# Patient Record
Sex: Female | Born: 1990 | Race: Black or African American | Hispanic: No | Marital: Single | Smoking: Never smoker
Health system: Southern US, Community
[De-identification: ages and names within clinical notes are randomized; demographics above are authoritative.]

## PROBLEM LIST (undated history)

## (undated) ENCOUNTER — Inpatient Hospital Stay (HOSPITAL_COMMUNITY): Payer: Self-pay

## (undated) DIAGNOSIS — I517 Cardiomegaly: Secondary | ICD-10-CM

## (undated) DIAGNOSIS — O139 Gestational [pregnancy-induced] hypertension without significant proteinuria, unspecified trimester: Secondary | ICD-10-CM

## (undated) DIAGNOSIS — R079 Chest pain, unspecified: Secondary | ICD-10-CM

## (undated) DIAGNOSIS — K5790 Diverticulosis of intestine, part unspecified, without perforation or abscess without bleeding: Secondary | ICD-10-CM

## (undated) HISTORY — DX: Chest pain, unspecified: R07.9

## (undated) HISTORY — DX: Diverticulosis of intestine, part unspecified, without perforation or abscess without bleeding: K57.90

## (undated) HISTORY — DX: Cardiomegaly: I51.7

---
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
## 2008-08-01 ENCOUNTER — Emergency Department (HOSPITAL_COMMUNITY): Admission: EM | Admit: 2008-08-01 | Discharge: 2008-08-01 | Payer: Self-pay | Admitting: Emergency Medicine

## 2009-03-11 ENCOUNTER — Emergency Department (HOSPITAL_COMMUNITY): Admission: EM | Admit: 2009-03-11 | Discharge: 2009-03-11 | Payer: Self-pay | Admitting: Emergency Medicine

## 2010-12-30 ENCOUNTER — Emergency Department (HOSPITAL_COMMUNITY)
Admission: EM | Admit: 2010-12-30 | Discharge: 2010-12-30 | Disposition: A | Discharge status: Home / Self Care | Payer: Medicaid Other | Attending: Emergency Medicine | Admitting: Emergency Medicine

## 2010-12-30 DIAGNOSIS — H5789 Other specified disorders of eye and adnexa: Secondary | ICD-10-CM | POA: Insufficient documentation

## 2010-12-30 DIAGNOSIS — H109 Unspecified conjunctivitis: Secondary | ICD-10-CM | POA: Insufficient documentation

## 2011-03-07 NOTE — Discharge Summary (Signed)
[name], [name]             ACCOUNT NO.:  [id number]   MEDICAL RECORD NO.:  [id number]          PATIENT TYPE:  INP   LOCATION:  9104                          FACILITY:  [facility]   PHYSICIAN:  Lesly Dukes, M.D. DATE OF BIRTH:  12-03-1990   DATE OF ADMISSION:  04/17/2005  DATE OF DISCHARGE:  04/24/2005                                 DISCHARGE SUMMARY   ADMISSION DIAGNOSES:  Intrauterine pregnancy.   DISCHARGE DIAGNOSES:  1.  Intrauterine pregnancy at [redacted] weeks gestation.  2.  Preeclampsia.  3.  Endometritis.   PROCEDURES:  Low transverse cesarean section reasons:  Fetal tachycardia  with variable fetal heart rate decelerations, arrest of active phase of  labor.   COMPLICATIONS:  Noted anaerobic infection from the uterus during cesarean  section.   HISTORY OF PRESENT ILLNESS:  Patient is a 20 year old G1, P0-0-0 presenting  at 40 weeks with a labor concern of contractions x1 day.  Patient was seen  in MAU on April 17, 2005.  Noted to be 2.5, 80, and -1 per cervical check.  Baseline fetal heart rate at 145 and reactive with moderate variability.  Patient was noted to be slightly elevated blood pressures at 149/98, 137/79,  137/91 in MAU.  Patient was admitted for [facility] laboratories and active labor.  Patient was noted to be rubella immune and GBS negative on admission with  negative GC and Chlamydia and declined HIV test.  Patient was admitted to  labor and delivery and given a trial of vaginal delivery with Pitocin.  Patient was noted to have trial for approximately 13 hours in labor with  arrest of labor.  It was also noted that fetal tachycardia with variable  fetal heart rates and decelerations upon contraction and with patient on  oxygen on her side it was noted that patient would be a cesarean delivery.  Risks and benefits of the procedure were discussed with patient.  Patient  consents were signed and on chart.  Patient decided to have low transverse  cesarean  section.   Dr. Corky Sox performed low transverse cesarean section on April 18, 2005.  Patient was brought to the operating room and anesthesia was  adequate.  A low transverse uterine incision was made.  The baby was noted  to have meconium stained fluid when the baby's head was delivered.  The  baby's head was DeLee suctioned.  Remainder of the baby was delivered and  the cord doubly clamped and cut, the baby handed to the neonatologists in  attendance.  The placenta delivered spontaneously.  However, prior to  delivery the placenta cord gases were attempted to be obtained but both  arteries were flat and several attempts were made for the umbilical artery  at the center, but were unable to obtain blood so a venous gas was sent.  Placenta was delivered spontaneously.  It was noted during the closure that  there was a hematoma that started approximately in the midline and went to  the left side of the patient in the bladder flap; however, this was stable  throughout the remainder of the procedure.  The patient delivered a female  baby with Apgars of 8 and 9 with weight of 6 pounds 9 ounces.  Patient was  followed up in the PACU and was continued on the medications which were  started during labor and delivery for her noted elevated high blood  pressures.  Patient was transported to the AICU for preeclampsia.  The  patient stayed for one day in the AICU, was noted to diurese well and was  then transferred to the floor.  Upon being transferred to the floor it was  noted that the patient began to develop temperatures on April 20, 2005 at  2300, was appropriately tender in her abdominal region.  The incision was  clear, dry, and intact.  It was noted that this could be probable  endometritis and that IV antibiotics would be needed.  There was an attempt  x2 performed on the IV on the right hand and both attempts were unsuccessful  performed by nurse Hutchins.  The patient then decided that  she would not  have IV antibiotics and nurse was brought into the room to discuss the need  for IV antibiotics and the patient was reassured that she could refuse any  treatment.  Patient refused IV antibiotics at this time.  Patient did  receive one dose of Cleocin p.o. on April 21, 2005.  It was noted then the  patient continued to have elevated temperatures of 101.6 at 1440 on April 21, 2005.  Once again, Dr. Dillard Essex discussed with patient and mother and  neighbor in room of the patient's need for IV antibiotics for treatment for  endometritis.  This need was also apparent due to her high elevations in  temperatures and the appropriate tenderness of her uterus.  Dr. Shawnie Pons was  brought into the room, attending, and discussed with patient of the need for  IV antibiotics.  The patient refused to receive IV antibiotics at this time  as well.  Patient stated she hated needles and the last nurse tried to stick  her several times and no luck with the IV.  Patient understood the lack of  improvement with the IV antibiotics and the risk of hysterectomy and death.  House coverage was contacted by Dr. Shawnie Pons at this time.   On April 21, 2005 1747 Dr. Dillard Essex and Dr. Shawnie Pons were called to the room by  the patient and the patient had decided to have IV placed by  anesthesiologist and receive IV antibiotics.  IV ampicillin and gentamicin  was begun at April 21, 2005 approximately 10 p.m. in the evening.  Patient was  noted once after starting IV antibiotics that there were no temperatures  above 100.5 with a Tmax being 100.3 and 100.2 on April 23, 2005.  The  patient's blood pressures remained stable; however, slightly elevated with  diastolics in the 90s throughout this admission.  The patient received one  day of antibiotics and on April 23, 2005 requested to go home.  The patient  was informed that the blood cultures and sensitivities were not back and needed to be confirmed for adequate __________ antibiotic  coverage p.o. once  she was home.  The patient's mother, also the maternal grandmother of the  baby called on the telephone, name being Rio Taber and discussed with  Dr. Dillard Essex and asked to speak to the doctor.  The patient's mother demanded  that the daughter go home with her baby.  The mother was informed of the  said medical  recommendations of the IV antibiotics and the importance of  staying afebrile for 48 hours post antibiotics.  The mother stated  expletives, and was informed of medical recommendation for IV antibiotics.  The patient understood that leaving prior to 48 hours being afebrile  starting antibiotics was against medical advice.  Patient did decide to stay  overnight as per the recommendations.  On July 6 it was noted that patient  did not have any temperatures above 100.5.  Her vital signs were stable;  however, blood pressures were 140s/120s systolic and 70s/90s diastolic.  General appearance was no apparent distress.  She was resting comfortably,  eating breakfast, lying in her bed.  Cardiovascular:  She was regular rate  and rhythm, no murmurs, rubs, or gallops.  Lungs were clear to auscultation  bilaterally.  Abdomen:  The incision was clear, dry, and intact.  She had a  firm uterus with moderate tenderness rating 2 out of a 10 on a pain scale.  Her laboratories revealed micro cultures x2 were no growth for five days.  The patient was then informed that she was able to go home on p.o.  antibiotics of Cipro and Flagyl.  Patient was also noted that she needed to  have a home health nurse or mother __________ unit nurse come and check her  blood pressure on the day following discharge for a recheck of the elevated  diastolic pressures.  Patient understood and voiced understanding of these  concerns.  Patient is now stable and ready for discharge on April 24, 2005.  Patient will be followed up by [facility], Dr. Shawnie Pons in two weeks and by  [facility] in six  weeks.  Patient is sent home on the Cipro and  Flagyl.  She will be followed up as discussed above.  The patient will be  receiving a Depo Provera shot IM 150 mg prior to discharge for  contraception.  The baby was a female.   Also to note case management was contacted throughout this entire admission  on the hospital environment of the interactions between the mother of the  patient and the sister of the patient.  It was noted that security was  called during the labor and delivery process.  Disagreements were noted  throughout the labor and delivery processes in the note per the nursing note  on June 30 and in the ICU for the regard of the patient.  There were also  encounters with the patient's mother where expletives were used throughout  and house coverage had to be notified.  Care management was notified  throughout this entire procedure and documented that the patient did have a stable home and will receive support and need from her mother at home.   DISCHARGE LABORATORIES:  Vital signs with temperature 99.2, pulse 99,  respirations 20, blood pressure 144/93.  Patient received 48 hours' worth of  IV clindamycin and gentamicin.  Postoperative hemoglobin was 8.7.  Sodium  141, potassium 3.7, BUN 2, creatinine 0.6.  Total bilirubin 0.7, alkaline  phosphatase 145, AST 17, ALT 9, total protein 5.4, albumin 1.7, calcium 8.6,  uric acid 4.5, LDH 138.  These were performed on July 3.  Also, chloride was  98, CO2 28, glucose 85.   DISCHARGE MEDICATIONS:  1.  Patient will be discharged home on Flagyl 500 mg one p.o. daily x7 days.      Will receive one dose prior to discharge.  2.  Cipro 500 mg t.i.d. x7 days.  3.  Ibuprofen 600 mg one tablet q.6h. p.r.n. pain.  4.  Depo Provera 150 mg IM x1 dose prior to discharge.       MB/MEDQ  D:  04/24/2005  T:  04/24/2005  Job:  [id number]   cc:   Shelbie Proctor. Shawnie Pons, M.D.  Fax: [phone]   [facility]

## 2011-03-07 NOTE — Op Note (Signed)
NAMEGORDANA, [name]             ACCOUNT NO.:  [id number]   MEDICAL RECORD NO.:  [id number]          PATIENT TYPE:  INP   LOCATION:  9168                          FACILITY:  [facility]   PHYSICIAN:  Conni Elliot, M.D.DATE OF BIRTH:  [birth year]   DATE OF PROCEDURE:  04/18/2005  DATE OF DISCHARGE:                                 OPERATIVE REPORT   PREOPERATIVE DIAGNOSES:  1.  Fetal tachycardia with variable fetal heart rate decelerations.  2.  Arrest of active phase of labor.   POSTOPERATIVE DIAGNOSES:  1.  Fetal tachycardia with variable fetal heart rate decelerations.  2.  Arrest of active phase of labor.   OPERATION:  Low transverse cesarean.   OPERATOR:  Conni Elliot, M.D.   ANESTHESIA:  Continuous lumbar epidural.   OPERATIVE PROCEDURE:  After bringing the patient to the operating room, the  patient supine in the left lateral tilt position, receiving oxygen.  The  abdomen was prepped and draped in a sterile fashion.  A low transverse  Pfannenstiel incision was made and the incision was made through the fascia,  the rectus muscles separated into the midline, peritoneum entered and  bladder flap created.  A low transverse uterine incision was made.  The baby  continued to have meconium-stained fluid so when the baby's head was  delivered, it was DeLee suctioned.  The remainder of the body was delivered,  the cord doubly clamped and cut and the baby handed to the neonatologist in  attendance.  The placenta was delivered spontaneously; however, prior to  delivery of the placenta cord gases were attempted to be obtained, but both  __________ arteries were flat and several attempts were made the umbilical  artery of the placenta, we were unable to obtain blood so a venous gas was  sent.  The placenta was delivered spontaneously.  The uterus and bladder  flap were closed.  It was noted during the closure that there was a hematoma  that started approximately in the midline and  went to the left side of the  patient in the bladder flap; however, this was stable throughout the  remainder of the procedure.  The bladder flap was closed after imbricating  stitch was placed.  Anterior peritoneum, fascia, subcutaneous tissue and  skin were closed in the usual fashion.  Estimated blood loss approximately  [phone] mL without replacement.  Instrument and sponge count were correct.       ASG/MEDQ  D:  04/18/2005  T:  04/18/2005  Job:  [id number]

## 2011-07-21 ENCOUNTER — Emergency Department (HOSPITAL_COMMUNITY): Payer: Medicaid Other

## 2011-07-21 ENCOUNTER — Emergency Department (HOSPITAL_COMMUNITY)
Admission: EM | Admit: 2011-07-21 | Discharge: 2011-07-21 | Disposition: A | Discharge status: Home / Self Care | Payer: Medicaid Other | Attending: Emergency Medicine | Admitting: Emergency Medicine

## 2011-07-21 DIAGNOSIS — M25519 Pain in unspecified shoulder: Secondary | ICD-10-CM | POA: Insufficient documentation

## 2011-07-21 DIAGNOSIS — IMO0002 Reserved for concepts with insufficient information to code with codable children: Secondary | ICD-10-CM | POA: Insufficient documentation

## 2011-07-21 DIAGNOSIS — M25419 Effusion, unspecified shoulder: Secondary | ICD-10-CM | POA: Insufficient documentation

## 2011-07-21 DIAGNOSIS — W010XXA Fall on same level from slipping, tripping and stumbling without subsequent striking against object, initial encounter: Secondary | ICD-10-CM | POA: Insufficient documentation

## 2011-07-21 DIAGNOSIS — M25619 Stiffness of unspecified shoulder, not elsewhere classified: Secondary | ICD-10-CM | POA: Insufficient documentation

## 2012-05-30 ENCOUNTER — Emergency Department (HOSPITAL_COMMUNITY)
Admission: EM | Admit: 2012-05-30 | Discharge: 2012-05-30 | Disposition: A | Discharge status: Home / Self Care | Payer: Medicaid Other | Attending: Emergency Medicine | Admitting: Emergency Medicine

## 2012-05-30 ENCOUNTER — Encounter (HOSPITAL_COMMUNITY): Payer: Self-pay

## 2012-05-30 ENCOUNTER — Emergency Department (HOSPITAL_COMMUNITY): Payer: Medicaid Other

## 2012-05-30 DIAGNOSIS — S62009A Unspecified fracture of navicular [scaphoid] bone of unspecified wrist, initial encounter for closed fracture: Secondary | ICD-10-CM

## 2012-05-30 DIAGNOSIS — W1789XA Other fall from one level to another, initial encounter: Secondary | ICD-10-CM | POA: Insufficient documentation

## 2012-05-30 MED ORDER — TRAMADOL HCL 50 MG PO TABS: 50.0000 mg | ORAL_TABLET | Freq: Four times a day (QID) | ORAL | Status: AC | PRN

## 2012-05-30 MED ORDER — HYDROCODONE-ACETAMINOPHEN 5-325 MG PO TABS
1.0000 | ORAL_TABLET | Freq: Once | ORAL | Status: AC
  Administered 2012-05-30: 1 via ORAL
  Filled 2012-05-30: qty 1

## 2012-05-30 NOTE — Progress Notes (Signed)
Orthopedic Tech Progress Note Patient Details:  [name] [birth year] [id number]  Ortho Devices Type of Ortho Device: Arm foam sling;Sugartong splint;Ace wrap Ortho Device/Splint Location: (L) UE Ortho Device/Splint Interventions: Application   [name] 05/30/2012, 5:05 PM

## 2012-05-30 NOTE — ED Notes (Signed)
Ortho tech paged to notify of orders 
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
## 2014-05-26 ENCOUNTER — Encounter (HOSPITAL_COMMUNITY): Payer: Self-pay

## 2014-05-26 ENCOUNTER — Inpatient Hospital Stay (HOSPITAL_COMMUNITY)
Admission: AD | Admit: 2014-05-26 | Discharge: 2014-05-26 | Disposition: A | Discharge status: Home / Self Care | Payer: Medicaid Other | Source: Ambulatory Visit | Attending: Obstetrics & Gynecology | Admitting: Obstetrics & Gynecology

## 2014-05-26 DIAGNOSIS — N898 Other specified noninflammatory disorders of vagina: Secondary | ICD-10-CM

## 2014-05-26 DIAGNOSIS — A499 Bacterial infection, unspecified: Secondary | ICD-10-CM | POA: Insufficient documentation

## 2014-05-26 DIAGNOSIS — R109 Unspecified abdominal pain: Secondary | ICD-10-CM | POA: Insufficient documentation

## 2014-05-26 DIAGNOSIS — Z87891 Personal history of nicotine dependence: Secondary | ICD-10-CM | POA: Insufficient documentation

## 2014-05-26 DIAGNOSIS — B9689 Other specified bacterial agents as the cause of diseases classified elsewhere: Secondary | ICD-10-CM | POA: Insufficient documentation

## 2014-05-26 DIAGNOSIS — N760 Acute vaginitis: Secondary | ICD-10-CM | POA: Insufficient documentation

## 2014-05-26 DIAGNOSIS — O26892 Other specified pregnancy related conditions, second trimester: Secondary | ICD-10-CM

## 2014-05-26 DIAGNOSIS — O2390 Unspecified genitourinary tract infection in pregnancy, unspecified trimester: Secondary | ICD-10-CM | POA: Insufficient documentation

## 2014-05-26 DIAGNOSIS — O9989 Other specified diseases and conditions complicating pregnancy, childbirth and the puerperium: Secondary | ICD-10-CM

## 2014-05-26 LAB — URINALYSIS, ROUTINE W REFLEX MICROSCOPIC
Bilirubin Urine: NEGATIVE
GLUCOSE, UA: NEGATIVE mg/dL
Hgb urine dipstick: NEGATIVE
KETONES UR: NEGATIVE mg/dL
LEUKOCYTES UA: NEGATIVE
NITRITE: NEGATIVE
PH: 6.5 (ref 5.0–8.0)
Protein, ur: NEGATIVE mg/dL
Urobilinogen, UA: 1 mg/dL (ref 0.0–1.0)

## 2014-05-26 LAB — WET PREP, GENITAL
TRICH WET PREP: NONE SEEN
WBC WET PREP: NONE SEEN
Yeast Wet Prep HPF POC: NONE SEEN

## 2014-05-26 MED ORDER — METRONIDAZOLE 500 MG PO TABS: 500.0000 mg | ORAL_TABLET | Freq: Two times a day (BID) | ORAL | Status: DC

## 2014-05-26 NOTE — MAU Note (Signed)
Patient states [facility] and found out she was pregnant. Wants an ultrasound.

## 2014-05-26 NOTE — MAU Provider Note (Signed)
History     CSN: [id number]  Arrival date and time: 05/26/14 1038   None     Chief Complaint  Patient presents with   Abdominal Pain   HPI  23 y.o. female at 6540w3d based on LMP presents complaining of abdominal pain that started last evening but is no longer present.  She has been sleeping while waiting in the exam room.  The pain has been gone for at least 2 hours since she laid down to rest.  She reports she had a positive pregnancy test [facility] the [facility] department last week and now she would like an ultrasound.  She saw Dr. Gaynell FaceMarshall with previous pregnancy and will likely return to him.  She is uncertain why she previously had a c-section.  No leaking of fluid.  No bleeding.     OB History   Grav Para Term Preterm Abortions TAB SAB Ect Mult Living   2 1 1       1       Past Medical History  Diagnosis Date   Medical history non-contributory     Past Surgical History  Procedure Laterality Date   Cesarean section      History reviewed. No pertinent family history.  History  Substance Use Topics   Smoking status: Former Smoker   Smokeless tobacco: Never Used   Alcohol Use: Yes    Allergies: No Known Allergies  No prescriptions prior to admission    Review of Systems  Constitutional: Negative for fever and chills.  Respiratory: Negative for cough and wheezing.   Cardiovascular: Negative for chest pain, palpitations and leg swelling.  Gastrointestinal: Positive for abdominal pain. Negative for nausea, vomiting, diarrhea and constipation.  Genitourinary: Positive for frequency. Negative for dysuria.  Skin: Negative for rash.  Neurological: Negative for weakness and headaches.   Physical Exam   Blood pressure 123/68, pulse 80, temperature 99.1 F (37.3 C), temperature source Oral, resp. rate 16, height 5' (1.524 m), weight 182 lb 6.4 oz (82.736 kg), SpO2 100.00%.   Physical Exam  Constitutional: She is oriented to person, place, and time. She appears  well-developed and well-nourished. No distress.  HENT:  Head: Normocephalic and atraumatic.  Eyes: EOM are normal.  Neck: Normal range of motion.  Cardiovascular: Normal rate, regular rhythm and normal heart sounds.  Exam reveals no gallop and no friction rub.   No murmur heard. Respiratory: Effort normal and breath sounds normal. No respiratory distress.  GI: Soft. She exhibits no distension. There is no tenderness.  Genitourinary: Vaginal discharge found.  Vagina with white malodorous discharge Uterus gravid to umbilicus  Musculoskeletal: Normal range of motion.  Neurological: She is alert and oriented to person, place, and time.  Skin: Skin is warm and dry.  Psychiatric: She has a normal mood and affect.   Results for orders placed during the hospital encounter of 05/26/14 (from the past 24 hour(s))  URINALYSIS, ROUTINE W REFLEX MICROSCOPIC     Status: Abnormal   Collection Time    05/26/14 10:55 AM      Result Value Ref Range   Color, Urine YELLOW  YELLOW   APPearance CLEAR  CLEAR   Specific Gravity, Urine <1.005 (*) 1.005 - 1.030   pH 6.5  5.0 - 8.0   Glucose, UA NEGATIVE  NEGATIVE mg/dL   Hgb urine dipstick NEGATIVE  NEGATIVE   Bilirubin Urine NEGATIVE  NEGATIVE   Ketones, ur NEGATIVE  NEGATIVE mg/dL   Protein, ur NEGATIVE  NEGATIVE mg/dL  Urobilinogen, UA 1.0  0.0 - 1.0 mg/dL   Nitrite NEGATIVE  NEGATIVE   Leukocytes, UA NEGATIVE  NEGATIVE  WET PREP, GENITAL     Status: Abnormal   Collection Time    05/26/14  1:37 PM      Result Value Ref Range   Yeast Wet Prep HPF POC NONE SEEN  NONE SEEN   Trich, Wet Prep NONE SEEN  NONE SEEN   Clue Cells Wet Prep HPF POC FEW (*) NONE SEEN   WBC, Wet Prep HPF POC NONE SEEN  NONE SEEN    MAU Course  Procedures none MDM Wet prep, neg U/A  Assessment and Plan  A: 19 weeks by uncertain LMP, Bacterial vaginosis  Plan: Discharge to home Flagyl 500mg  1 bid x 7 days.  #14.  NR No intercourse x 10 days Return to MAU PRN Begin  prenatal care asap.    Duane Boston Clark 05/26/2014, 1:16 PM

## 2014-05-26 NOTE — MAU Note (Signed)
Patient states she has been having sharp pain since last night that comes and goes. Denies bleeding or leaking and reports good fetal movement.

## 2014-05-26 NOTE — MAU Provider Note (Signed)
Attestation of Attending Supervision of Advanced Practitioner (CNM/NP): Evaluation and management procedures were performed by the Advanced Practitioner under my supervision and collaboration. I have reviewed the Advanced Practitioner's note and chart, and I agree with the management and plan.  Nik Gorrell H. 7:05 PM   

## 2014-05-26 NOTE — Discharge Instructions (Signed)
Bacterial Vaginosis °Bacterial vaginosis is a vaginal infection that occurs when the normal balance of bacteria in the vagina is disrupted. It results from an overgrowth of certain bacteria. This is the most common vaginal infection in women of childbearing age. Treatment is important to prevent complications, especially in pregnant women, as it can cause a premature delivery. °CAUSES  °Bacterial vaginosis is caused by an increase in harmful bacteria that are normally present in smaller amounts in the vagina. Several different kinds of bacteria can cause bacterial vaginosis. However, the reason that the condition develops is not fully understood. °RISK FACTORS °Certain activities or behaviors can put you at an increased risk of developing bacterial vaginosis, including: °· Having a new sex partner or multiple sex partners. °· Douching. °· Using an intrauterine device (IUD) for contraception. °Women do not get bacterial vaginosis from toilet seats, bedding, swimming pools, or contact with objects around them. °SIGNS AND SYMPTOMS  °Some women with bacterial vaginosis have no signs or symptoms. Common symptoms include: °· Grey vaginal discharge. °· A fishlike odor with discharge, especially after sexual intercourse. °· Itching or burning of the vagina and vulva. °· Burning or pain with urination. °DIAGNOSIS  °Your health care provider will take a medical history and examine the vagina for signs of bacterial vaginosis. A sample of vaginal fluid may be taken. Your health care provider will look at this sample under a microscope to check for bacteria and abnormal cells. A vaginal pH test may also be done.  °TREATMENT  °Bacterial vaginosis may be treated with antibiotic medicines. These may be given in the form of a pill or a vaginal cream. A second round of antibiotics may be prescribed if the condition comes back after treatment.  °HOME CARE INSTRUCTIONS  °· Only take over-the-counter or prescription medicines as  directed by your health care provider. °· If antibiotic medicine was prescribed, take it as directed. Make sure you finish it even if you start to feel better. °· Do not have sex until treatment is completed. °· Tell all sexual partners that you have a vaginal infection. They should see their health care provider and be treated if they have problems, such as a mild rash or itching. °· Practice safe sex by using condoms and only having one sex partner. °SEEK MEDICAL CARE IF:  °· Your symptoms are not improving after 3 days of treatment. °· You have increased discharge or pain. °· You have a fever. °MAKE SURE YOU:  °· Understand these instructions. °· Will watch your condition. °· Will get help right away if you are not doing well or get worse. °FOR MORE INFORMATION  °Centers for Disease Control and Prevention, Division of STD Prevention: www.cdc.gov/std °American Sexual Health Association (ASHA): www.ashastd.org  °Document Released: 10/06/2005 Document Revised: 07/27/2013 Document Reviewed: 05/18/2013 °ExitCare® Patient Information ©2015 [employer]. This information is not intended to replace advice given to you by your health care provider. Make sure you discuss any questions you have with your health care provider. °Second Trimester of Pregnancy °The second trimester is from week 13 through week 28, months 4 through 6. The second trimester is often a time when you feel your best. Your body has also adjusted to being pregnant, and you begin to feel better physically. Usually, morning sickness has lessened or quit completely, you may have more energy, and you may have an increase in appetite. The second trimester is also a time when the fetus is growing rapidly. At the end of the sixth   month, the fetus is about 9 inches long and weighs about 1½ pounds. You will likely begin to feel the baby move (quickening) between 18 and 20 weeks of the pregnancy. °BODY CHANGES °Your body goes through many changes during  pregnancy. The changes vary from woman to woman.  °· Your weight will continue to increase. You will notice your lower abdomen bulging out. °· You may begin to get stretch marks on your hips, abdomen, and breasts. °· You may develop headaches that can be relieved by medicines approved by your health care provider. °· You may urinate more often because the fetus is pressing on your bladder. °· You may develop or continue to have heartburn as a result of your pregnancy. °· You may develop constipation because certain hormones are causing the muscles that push waste through your intestines to slow down. °· You may develop hemorrhoids or swollen, bulging veins (varicose veins). °· You may have back pain because of the weight gain and pregnancy hormones relaxing your joints between the bones in your pelvis and as a result of a shift in weight and the muscles that support your balance. °· Your breasts will continue to grow and be tender. °· Your gums may bleed and may be sensitive to brushing and flossing. °· Dark spots or blotches (chloasma, mask of pregnancy) may develop on your face. This will likely fade after the baby is born. °· A dark line from your belly button to the pubic area (linea nigra) may appear. This will likely fade after the baby is born. °· You may have changes in your hair. These can include thickening of your hair, rapid growth, and changes in texture. Some women also have hair loss during or after pregnancy, or hair that feels dry or thin. Your hair will most likely return to normal after your baby is born. °WHAT TO EXPECT AT YOUR PRENATAL VISITS °During a routine prenatal visit: °· You will be weighed to make sure you and the fetus are growing normally. °· Your blood pressure will be taken. °· Your abdomen will be measured to track your baby's growth. °· The fetal heartbeat will be listened to. °· Any test results from the previous visit will be discussed. °Your health care provider may ask  you: °· How you are feeling. °· If you are feeling the baby move. °· If you have had any abnormal symptoms, such as leaking fluid, bleeding, severe headaches, or abdominal cramping. °· If you have any questions. °Other tests that may be performed during your second trimester include: °· Blood tests that check for: °¨ Low iron levels (anemia). °¨ Gestational diabetes (between 24 and 28 weeks). °¨ Rh antibodies. °· Urine tests to check for infections, diabetes, or protein in the urine. °· An ultrasound to confirm the proper growth and development of the baby. °· An amniocentesis to check for possible genetic problems. °· Fetal screens for spina bifida and Down syndrome. °HOME CARE INSTRUCTIONS  °· Avoid all smoking, herbs, alcohol, and unprescribed drugs. These chemicals affect the formation and growth of the baby. °· Follow your health care provider's instructions regarding medicine use. There are medicines that are either safe or unsafe to take during pregnancy. °· Exercise only as directed by your health care provider. Experiencing uterine cramps is a good sign to stop exercising. °· Continue to eat regular, healthy meals. °· Wear a good support bra for breast tenderness. °· Do not use hot tubs, steam rooms, or saunas. °· Wear your seat   belt at all times when driving. °· Avoid raw meat, uncooked cheese, cat litter boxes, and soil used by cats. These carry germs that can cause birth defects in the baby. °· Take your prenatal vitamins. °· Try taking a stool softener (if your health care provider approves) if you develop constipation. Eat more high-fiber foods, such as fresh vegetables or fruit and whole grains. Drink plenty of fluids to keep your urine clear or pale yellow. °· Take warm sitz baths to soothe any pain or discomfort caused by hemorrhoids. Use hemorrhoid cream if your health care provider approves. °· If you develop varicose veins, wear support hose. Elevate your feet for 15 minutes, 3-4 times a day.  Limit salt in your diet. °· Avoid heavy lifting, wear low heel shoes, and practice good posture. °· Rest with your legs elevated if you have leg cramps or low back pain. °· Visit your dentist if you have not gone yet during your pregnancy. Use a soft toothbrush to brush your teeth and be gentle when you floss. °· A sexual relationship may be continued unless your health care provider directs you otherwise. °· Continue to go to all your prenatal visits as directed by your health care provider. °SEEK MEDICAL CARE IF:  °· You have dizziness. °· You have mild pelvic cramps, pelvic pressure, or nagging pain in the abdominal area. °· You have persistent nausea, vomiting, or diarrhea. °· You have a bad smelling vaginal discharge. °· You have pain with urination. °SEEK IMMEDIATE MEDICAL CARE IF:  °· You have a fever. °· You are leaking fluid from your vagina. °· You have spotting or bleeding from your vagina. °· You have severe abdominal cramping or pain. °· You have rapid weight gain or loss. °· You have shortness of breath with chest pain. °· You notice sudden or extreme swelling of your face, hands, ankles, feet, or legs. °· You have not felt your baby move in over an hour. °· You have severe headaches that do not go away with medicine. °· You have vision changes. °Document Released: 09/30/2001 Document Revised: 10/11/2013 Document Reviewed: 12/07/2012 °ExitCare® Patient Information ©2015 [employer]. This information is not intended to replace advice given to you by your health care provider. Make sure you discuss any questions you have with your health care provider. ° °

## 2014-05-27 LAB — GC/CHLAMYDIA PROBE AMP
CT Probe RNA: NEGATIVE
GC Probe RNA: NEGATIVE

## 2014-06-16 ENCOUNTER — Inpatient Hospital Stay (HOSPITAL_COMMUNITY)
Admission: AD | Admit: 2014-06-16 | Discharge: 2014-06-16 | Disposition: A | Discharge status: Home / Self Care | Payer: Self-pay | Source: Ambulatory Visit | Attending: Obstetrics & Gynecology | Admitting: Obstetrics & Gynecology

## 2014-06-16 ENCOUNTER — Inpatient Hospital Stay (HOSPITAL_COMMUNITY): Payer: Medicaid Other

## 2014-06-16 ENCOUNTER — Encounter (HOSPITAL_COMMUNITY): Payer: Self-pay | Admitting: *Deleted

## 2014-06-16 DIAGNOSIS — O9989 Other specified diseases and conditions complicating pregnancy, childbirth and the puerperium: Secondary | ICD-10-CM

## 2014-06-16 DIAGNOSIS — R109 Unspecified abdominal pain: Secondary | ICD-10-CM | POA: Insufficient documentation

## 2014-06-16 DIAGNOSIS — Z87891 Personal history of nicotine dependence: Secondary | ICD-10-CM | POA: Insufficient documentation

## 2014-06-16 DIAGNOSIS — R198 Other specified symptoms and signs involving the digestive system and abdomen: Secondary | ICD-10-CM

## 2014-06-16 DIAGNOSIS — O99891 Other specified diseases and conditions complicating pregnancy: Secondary | ICD-10-CM | POA: Insufficient documentation

## 2014-06-16 LAB — URINALYSIS, ROUTINE W REFLEX MICROSCOPIC
BILIRUBIN URINE: NEGATIVE
GLUCOSE, UA: NEGATIVE mg/dL
KETONES UR: NEGATIVE mg/dL
Leukocytes, UA: NEGATIVE
Nitrite: NEGATIVE
PH: 7.5 (ref 5.0–8.0)
PROTEIN: NEGATIVE mg/dL
Specific Gravity, Urine: <1.005 — ABNORMAL LOW (ref 1.005–1.030)
Urobilinogen, UA: 0.2 mg/dL (ref 0.0–1.0)

## 2014-06-16 LAB — URINE MICROSCOPIC-ADD ON

## 2014-06-16 LAB — WET PREP, GENITAL
CLUE CELLS WET PREP: NONE SEEN
TRICH WET PREP: NONE SEEN
WBC WET PREP: NONE SEEN
YEAST WET PREP: NONE SEEN

## 2014-06-16 NOTE — MAU Provider Note (Signed)
  History     CSN: [id number]  Arrival date and time: 06/16/14 1306   None     Chief Complaint  Patient presents with  . Abdominal Pain   HPI  Reporting last night thought she was having a miscarriage, saw a drop of blood in underwear, does not know what she was doing when she started spotting.  10/10 pain last night(but was able to work through pain), this morning 5/10, pain over entire abdomen radiating to pelvis.  No alleviating, no aggravating factors.  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  Currently asymptomatic and comfortable.  Would like an ultrasound today and to know sex.  No spotting since, last intercourse 2 weeks ago.  +FM, no vaginal discharge, no LOF  Past Medical History  Diagnosis Date  . Medical history non-contributory     Past Surgical History  Procedure Laterality Date  . Cesarean section      History reviewed. No pertinent family history.  History  Substance Use Topics  . Smoking status: Former Games developer  . Smokeless tobacco: Never Used  . Alcohol Use: Yes    Allergies: No Known Allergies  Prescriptions prior to admission  Medication Sig Dispense Refill  . metroNIDAZOLE (FLAGYL) 500 MG tablet Take 1 tablet (500 mg total) by mouth 2 (two) times daily.  14 tablet  0    Review of Systems  Constitutional: Negative for fever, chills and weight loss.  Respiratory: Positive for shortness of breath (not currently). Negative for cough and sputum production.   Cardiovascular: Negative for leg swelling.  Gastrointestinal: Positive for nausea and vomiting (x1 today). Negative for diarrhea and constipation.  Genitourinary: Positive for frequency (baseline). Negative for dysuria and urgency.   Physical Exam   Blood pressure 115/72, pulse 88, temperature 98.2 F (36.8 C), temperature source Oral, resp. rate 18, height  (1.549 m), weight 178 lb 6.4 oz (80.922 kg).  Physical Exam  Constitutional: She is oriented  to person, place, and time. She appears well-developed and well-nourished.  HENT:  Head: Normocephalic and atraumatic.  Eyes: Conjunctivae and EOM are normal.  Neck: Normal range of motion.  Cardiovascular: Normal rate.   Respiratory: Effort normal. No respiratory distress.  GI: Soft. She exhibits no distension. There is no tenderness.  Musculoskeletal: Normal range of motion. She exhibits no edema.  Neurological: She is alert and oriented to person, place, and time.  Skin: Skin is warm and dry. No erythema.    MAU Course  Procedures  MDM   Assessment and Plan  Reports she will call Dr. Tawny Hopping office to start prenatal care (he delivered previous child) OB limited sono to rule out previa - no previa, CL 4cm Speculum exam: white discharge ==> wet prep normal  [name] 06/16/2014, 2:11 PM

## 2014-06-16 NOTE — MAU Note (Signed)
Pt had severe abd cramping last night, cramping continues today, not as bad.  Had scant bleeding last night, none today.

## 2014-06-16 NOTE — Discharge Instructions (Signed)
Second Trimester of Pregnancy The second trimester is from week 13 through week 28, month 4 through 6. This is often the time in pregnancy that you feel your best. Often times, morning sickness has lessened or quit. You may have more energy, and you may get hungry more often. Your unborn baby (fetus) is growing rapidly. At the end of the sixth month, he or she is about 9 inches long and weighs about 1 pounds. You will likely feel the baby move (quickening) between 18 and 20 weeks of pregnancy. HOME CARE   Avoid all smoking, herbs, and alcohol. Avoid drugs not approved by your doctor.  Only take medicine as told by your doctor. Some medicines are safe and some are not during pregnancy.  Exercise only as told by your doctor. Stop exercising if you start having cramps.  Eat regular, healthy meals.  Wear a good support bra if your breasts are tender.  Do not use hot tubs, steam rooms, or saunas.  Wear your seat belt when driving.  Avoid raw meat, uncooked cheese, and liter boxes and soil used by cats.  Take your prenatal vitamins.  Try taking medicine that helps you poop (stool softener) as needed, and if your doctor approves. Eat more fiber by eating fresh fruit, vegetables, and whole grains. Drink enough fluids to keep your pee (urine) clear or pale yellow.  Take warm water baths (sitz baths) to soothe pain or discomfort caused by hemorrhoids. Use hemorrhoid cream if your doctor approves.  If you have puffy, bulging veins (varicose veins), wear support hose. Raise (elevate) your feet for 15 minutes, 3-4 times a day. Limit salt in your diet.  Avoid heavy lifting, wear low heals, and sit up straight.  Rest with your legs raised if you have leg cramps or low back pain.  Visit your dentist if you have not gone during your pregnancy. Use a soft toothbrush to brush your teeth. Be gentle when you floss.  You can have sex (intercourse) unless your doctor tells you not to.  Go to your  doctor visits. GET HELP IF:   You feel dizzy.  You have mild cramps or pressure in your lower belly (abdomen).  You have a nagging pain in your belly area.  You continue to feel sick to your stomach (nauseous), throw up (vomit), or have watery poop (diarrhea).  You have bad smelling fluid coming from your vagina.  You have pain with peeing (urination). GET HELP RIGHT AWAY IF:   You have a fever.  You are leaking fluid from your vagina.  You have spotting or bleeding from your vagina.  You have severe belly cramping or pain.  You lose or gain weight rapidly.  You have trouble catching your breath and have chest pain.  You notice sudden or extreme puffiness (swelling) of your face, hands, ankles, feet, or legs.  You have not felt the baby move in over an hour.  You have severe headaches that do not go away with medicine.  You have vision changes. Document Released: 12/31/2009 Document Revised: 01/31/2013 Document Reviewed: 12/07/2012 ExitCare Patient Information 2015 [employer]. This information is not intended to replace advice given to you by your health care provider. Make sure you discuss any questions you have with your health care provider.  

## 2014-06-16 NOTE — MAU Provider Note (Signed)
Attestation of Attending Supervision of Fellow: Evaluation and management procedures were performed by the Fellow under my supervision and collaboration.  I have reviewed the Fellow's note and chart, and I agree with the management and plan.    

## 2014-08-21 ENCOUNTER — Encounter (HOSPITAL_COMMUNITY): Payer: Self-pay | Admitting: *Deleted

## 2014-09-08 LAB — OB RESULTS CONSOLE HEPATITIS B SURFACE ANTIGEN: HEP B S AG: NEGATIVE

## 2014-09-08 LAB — OB RESULTS CONSOLE RUBELLA ANTIBODY, IGM: Rubella: IMMUNE

## 2014-09-08 LAB — OB RESULTS CONSOLE HIV ANTIBODY (ROUTINE TESTING): HIV: NONREACTIVE

## 2014-09-25 ENCOUNTER — Other Ambulatory Visit: Payer: Self-pay | Admitting: Obstetrics

## 2014-10-01 ENCOUNTER — Encounter (HOSPITAL_COMMUNITY): Payer: Self-pay | Admitting: *Deleted

## 2014-10-01 ENCOUNTER — Inpatient Hospital Stay (HOSPITAL_COMMUNITY)
Admission: AD | Admit: 2014-10-01 | Discharge: 2014-10-01 | Disposition: A | Discharge status: Home / Self Care | Payer: Medicaid Other | Source: Ambulatory Visit | Attending: Family Medicine | Admitting: Family Medicine

## 2014-10-01 DIAGNOSIS — Z3A38 38 weeks gestation of pregnancy: Secondary | ICD-10-CM | POA: Insufficient documentation

## 2014-10-01 DIAGNOSIS — O471 False labor at or after 37 completed weeks of gestation: Secondary | ICD-10-CM | POA: Diagnosis not present

## 2014-10-01 NOTE — MAU Note (Signed)
Pt arrives via EMS with complaint of contractions. Denies bleeding or ROM

## 2014-10-01 NOTE — Discharge Instructions (Signed)
Braxton Hicks Contractions °Contractions of the uterus can occur throughout pregnancy. Contractions are not always a sign that you are in labor.  °WHAT ARE BRAXTON HICKS CONTRACTIONS?  °Contractions that occur before labor are called Braxton Hicks contractions, or false labor. Toward the end of pregnancy (32-34 weeks), these contractions can develop more often and may become more forceful. This is not true labor because these contractions do not result in opening (dilatation) and thinning of the cervix. They are sometimes difficult to tell apart from true labor because these contractions can be forceful and people have different pain tolerances. You should not feel embarrassed if you go to the hospital with false labor. Sometimes, the only way to tell if you are in true labor is for your health care provider to look for changes in the cervix. °If there are no prenatal problems or other health problems associated with the pregnancy, it is completely safe to be sent home with false labor and await the onset of true labor. °HOW CAN YOU TELL THE DIFFERENCE BETWEEN TRUE AND FALSE LABOR? °False Labor °· The contractions of false labor are usually shorter and not as hard as those of true labor.   °· The contractions are usually irregular.   °· The contractions are often felt in the front of the lower abdomen and in the groin.   °· The contractions may go away when you walk around or change positions while lying down.   °· The contractions get weaker and are shorter lasting as time goes on.   °· The contractions do not usually become progressively stronger, regular, and closer together as with true labor.   °True Labor °· Contractions in true labor last 30-70 seconds, become very regular, usually become more intense, and increase in frequency.   °· The contractions do not go away with walking.   °· The discomfort is usually felt in the top of the uterus and spreads to the lower abdomen and low back.   °· True labor can be  determined by your health care provider with an exam. This will show that the cervix is dilating and getting thinner.   °WHAT TO REMEMBER °· Keep up with your usual exercises and follow other instructions given by your health care provider.   °· Take medicines as directed by your health care provider.   °· Keep your regular prenatal appointments.   °· Eat and drink lightly if you think you are going into labor.   °· If Braxton Hicks contractions are making you uncomfortable:   °¨ Change your position from lying down or resting to walking, or from walking to resting.   °¨ Sit and rest in a tub of warm water.   °¨ Drink 2-3 glasses of water. Dehydration may cause these contractions.   °¨ Do slow and deep breathing several times an hour.   °WHEN SHOULD I SEEK IMMEDIATE MEDICAL CARE? °Seek immediate medical care if: °· Your contractions become stronger, more regular, and closer together.   °· You have fluid leaking or gushing from your vagina.   °· You have a fever.   °· You pass blood-tinged mucus.   °· You have vaginal bleeding.   °· You have continuous abdominal pain.   °· You have low back pain that you never had before.   °· You feel your baby's head pushing down and causing pelvic pressure.   °· Your baby is not moving as much as it used to.   °Document Released: 10/06/2005 Document Revised: 10/11/2013 Document Reviewed: 07/18/2013 °ExitCare® Patient Information ©2015 [employer]. This information is not intended to replace advice given to you by your health care   provider. Make sure you discuss any questions you have with your health care provider. ° °

## 2014-10-11 NOTE — H&P (Signed)
[name]             ACCOUNT NO.:  [id number]  MEDICAL RECORD NO.:  [id number]  LOCATION:  [facility]                         FACILITY:  [facility]  PHYSICIAN:  [name], M.D.DATE OF BIRTH:  11-29-1990  DATE OF ADMISSION:  10/17/2014 DATE OF DISCHARGE:                             HISTORY & PHYSICAL   The patient is a 23 year old, gravida 2, para1-0-0-1, due October 21, 2014.  She had a previous C-section and desires a repeat.  Her prenatal course has been benign during this pregnancy and she is for repeat C- section on October 17, 2014.  PAST MEDICAL HISTORY:  Negative.  PAST SURGICAL HISTORY:  C-section x1.  SOCIAL HISTORY:  Negative.  SYSTEM REVIEW:  Noncontributory.  PHYSICAL EXAMINATION:  GENERAL:  Well-developed female in no distress. HEENT:  Negative. LUNGS:  Clear to P and A. HEART:  Regular rhythm.  No murmurs, no gallops. BREASTS:  Negative. ABDOMEN:  Term. PELVIC:  As described above.  EXTREMITIES:  Negative.          ______________________________ [name], M.D.     BAM/MEDQ  D:  10/11/2014  T:  10/11/2014  Job:  [id number]

## 2014-10-16 ENCOUNTER — Encounter (HOSPITAL_COMMUNITY)
Admission: RE | Admit: 2014-10-16 | Discharge: 2014-10-16 | Disposition: A | Discharge status: Home / Self Care | Payer: Medicaid Other | Source: Ambulatory Visit | Attending: Obstetrics | Admitting: Obstetrics

## 2014-10-16 ENCOUNTER — Encounter (HOSPITAL_COMMUNITY): Payer: Self-pay

## 2014-10-16 LAB — CBC
HEMATOCRIT: 33.8 % — AB (ref 36.0–46.0)
Hemoglobin: 11.3 g/dL — ABNORMAL LOW (ref 12.0–15.0)
MCH: 29.9 pg (ref 26.0–34.0)
MCHC: 33.4 g/dL (ref 30.0–36.0)
MCV: 89.4 fL (ref 78.0–100.0)
Platelets: 261 10*3/uL (ref 150–400)
RBC: 3.78 MIL/uL — ABNORMAL LOW (ref 3.87–5.11)
RDW: 13.1 % (ref 11.5–15.5)
WBC: 6.7 10*3/uL (ref 4.0–10.5)

## 2014-10-16 LAB — RPR

## 2014-10-16 NOTE — Patient Instructions (Signed)
   Your procedure is scheduled on: dec 29 at 1115 Enter through the Main Entrance of [facility] at:930am Pick up the phone at the desk and dial [phone] and inform us of your arrival.  Please call this number if you have any problems the morning of surgery: [phone]  Remember: Do not eat food after midnight:dec 28 Do not drink clear liquids after:7am  Take these medicines the morning of surgery with a SIP OF WATER:  Do not wear jewelry, make-up, or FINGER nail polish No metal in your hair or on your body. Do not wear lotions, powders, perfumes.  You may wear deodorant.  Do not bring valuables to the hospital. Contacts, dentures or bridgework may not be worn into surgery.  Leave suitcase in the car. After Surgery it may be brought to your room. For patients being admitted to the hospital, checkout time is 11:00am the day of discharge.    Patients discharged on the day of surgery will not be allowed to drive home.

## 2014-10-17 ENCOUNTER — Inpatient Hospital Stay (HOSPITAL_COMMUNITY): Payer: Medicaid Other | Admitting: Anesthesiology

## 2014-10-17 ENCOUNTER — Inpatient Hospital Stay (HOSPITAL_COMMUNITY)
Admission: RE | Admit: 2014-10-17 | Discharge: 2014-10-20 | DRG: 766 | Disposition: A | Discharge status: Home / Self Care | Payer: Medicaid Other | Source: Ambulatory Visit | Attending: Obstetrics | Admitting: Obstetrics

## 2014-10-17 ENCOUNTER — Encounter (HOSPITAL_COMMUNITY): Payer: Self-pay | Admitting: *Deleted

## 2014-10-17 ENCOUNTER — Encounter (HOSPITAL_COMMUNITY)
Admission: RE | Disposition: A | Discharge status: Home / Self Care | Payer: Self-pay | Source: Ambulatory Visit | Attending: Obstetrics

## 2014-10-17 DIAGNOSIS — O3421 Maternal care for scar from previous cesarean delivery: Principal | ICD-10-CM | POA: Diagnosis present

## 2014-10-17 DIAGNOSIS — Z3A40 40 weeks gestation of pregnancy: Secondary | ICD-10-CM | POA: Diagnosis present

## 2014-10-17 DIAGNOSIS — Z98891 History of uterine scar from previous surgery: Secondary | ICD-10-CM

## 2014-10-17 LAB — TYPE AND SCREEN
ABO/RH(D): AB POS
Antibody Screen: NEGATIVE

## 2014-10-17 LAB — ABO/RH: ABO/RH(D): AB POS

## 2014-10-17 SURGERY — Surgical Case
Anesthesia: Spinal | Site: Abdomen

## 2014-10-17 MED ORDER — OXYTOCIN 40 UNITS IN LACTATED RINGERS INFUSION - SIMPLE MED: 62.5000 mL/h | INTRAVENOUS | Status: AC

## 2014-10-17 MED ORDER — SCOPOLAMINE 1 MG/3DAYS TD PT72
1.0000 | MEDICATED_PATCH | Freq: Once | TRANSDERMAL | Status: DC
  Administered 2014-10-17: 1.5 mg via TRANSDERMAL

## 2014-10-17 MED ORDER — NALBUPHINE HCL 10 MG/ML IJ SOLN: 5.0000 mg | Freq: Once | INTRAMUSCULAR | Status: AC | PRN

## 2014-10-17 MED ORDER — KETOROLAC TROMETHAMINE 30 MG/ML IJ SOLN
INTRAMUSCULAR | Status: AC
Start: 2014-10-17 — End: 2014-10-17
  Administered 2014-10-17: 30 mg via INTRAMUSCULAR
  Filled 2014-10-17: qty 1

## 2014-10-17 MED ORDER — MORPHINE SULFATE (PF) 0.5 MG/ML IJ SOLN
INTRAMUSCULAR | Status: DC | PRN
  Administered 2014-10-17: .1 mg via INTRATHECAL

## 2014-10-17 MED ORDER — ZOLPIDEM TARTRATE 5 MG PO TABS: 5.0000 mg | ORAL_TABLET | Freq: Every evening | ORAL | Status: DC | PRN

## 2014-10-17 MED ORDER — BUPIVACAINE IN DEXTROSE 0.75-8.25 % IT SOLN
INTRATHECAL | Status: DC | PRN
  Administered 2014-10-17: 1.4 mL via INTRATHECAL

## 2014-10-17 MED ORDER — OXYTOCIN 10 UNIT/ML IJ SOLN
INTRAMUSCULAR | Status: AC
  Filled 2014-10-17: qty 4

## 2014-10-17 MED ORDER — LACTATED RINGERS IV SOLN
INTRAVENOUS | Status: DC | PRN
  Administered 2014-10-17 (×2): via INTRAVENOUS

## 2014-10-17 MED ORDER — ONDANSETRON HCL 4 MG/2ML IJ SOLN: 4.0000 mg | Freq: Three times a day (TID) | INTRAMUSCULAR | Status: DC | PRN

## 2014-10-17 MED ORDER — SIMETHICONE 80 MG PO CHEW: 80.0000 mg | CHEWABLE_TABLET | ORAL | Status: DC | PRN

## 2014-10-17 MED ORDER — IBUPROFEN 600 MG PO TABS: 600.0000 mg | ORAL_TABLET | Freq: Four times a day (QID) | ORAL | Status: DC | PRN

## 2014-10-17 MED ORDER — DIBUCAINE 1 % RE OINT: 1.0000 "application " | TOPICAL_OINTMENT | RECTAL | Status: DC | PRN

## 2014-10-17 MED ORDER — SIMETHICONE 80 MG PO CHEW
80.0000 mg | CHEWABLE_TABLET | ORAL | Status: DC
  Administered 2014-10-18 – 2014-10-20 (×3): 80 mg via ORAL
  Filled 2014-10-17 (×3): qty 1

## 2014-10-17 MED ORDER — MEPERIDINE HCL 25 MG/ML IJ SOLN: 6.2500 mg | INTRAMUSCULAR | Status: DC | PRN

## 2014-10-17 MED ORDER — PRENATAL MULTIVITAMIN CH
1.0000 | ORAL_TABLET | Freq: Every day | ORAL | Status: DC
  Administered 2014-10-18 – 2014-10-19 (×2): 1 via ORAL
  Filled 2014-10-17 (×2): qty 1

## 2014-10-17 MED ORDER — OXYTOCIN 40 UNITS IN LACTATED RINGERS INFUSION - SIMPLE MED
INTRAVENOUS | Status: DC | PRN
  Administered 2014-10-17: 40 [IU] via INTRAVENOUS

## 2014-10-17 MED ORDER — MEPERIDINE HCL 25 MG/ML IJ SOLN
6.2500 mg | INTRAMUSCULAR | Status: DC | PRN
Start: 2014-10-17 — End: 2014-10-17

## 2014-10-17 MED ORDER — PROMETHAZINE HCL 25 MG/ML IJ SOLN: 6.2500 mg | INTRAMUSCULAR | Status: DC | PRN

## 2014-10-17 MED ORDER — CEFAZOLIN SODIUM-DEXTROSE 2-3 GM-% IV SOLR
INTRAVENOUS | Status: AC
  Filled 2014-10-17: qty 50

## 2014-10-17 MED ORDER — MORPHINE SULFATE 0.5 MG/ML IJ SOLN
INTRAMUSCULAR | Status: AC
  Filled 2014-10-17: qty 10

## 2014-10-17 MED ORDER — ONDANSETRON HCL 4 MG/2ML IJ SOLN
INTRAMUSCULAR | Status: AC
  Filled 2014-10-17: qty 2

## 2014-10-17 MED ORDER — DIPHENHYDRAMINE HCL 50 MG/ML IJ SOLN: 12.5000 mg | INTRAMUSCULAR | Status: DC | PRN

## 2014-10-17 MED ORDER — CEFAZOLIN SODIUM-DEXTROSE 2-3 GM-% IV SOLR
2.0000 g | INTRAVENOUS | Status: AC
  Administered 2014-10-17: 2 g via INTRAVENOUS

## 2014-10-17 MED ORDER — ONDANSETRON HCL 4 MG/2ML IJ SOLN: 4.0000 mg | INTRAMUSCULAR | Status: DC | PRN

## 2014-10-17 MED ORDER — DIPHENHYDRAMINE HCL 25 MG PO CAPS
25.0000 mg | ORAL_CAPSULE | ORAL | Status: DC | PRN
Start: 2014-10-17 — End: 2014-10-20
  Filled 2014-10-17: qty 1

## 2014-10-17 MED ORDER — MENTHOL 3 MG MT LOZG: 1.0000 | LOZENGE | OROMUCOSAL | Status: DC | PRN

## 2014-10-17 MED ORDER — LANOLIN HYDROUS EX OINT: 1.0000 "application " | TOPICAL_OINTMENT | CUTANEOUS | Status: DC | PRN

## 2014-10-17 MED ORDER — WITCH HAZEL-GLYCERIN EX PADS: 1.0000 "application " | MEDICATED_PAD | CUTANEOUS | Status: DC | PRN

## 2014-10-17 MED ORDER — NALBUPHINE HCL 10 MG/ML IJ SOLN: 5.0000 mg | INTRAMUSCULAR | Status: DC | PRN

## 2014-10-17 MED ORDER — LACTATED RINGERS IV SOLN
INTRAVENOUS | Status: DC
  Administered 2014-10-17: 20:00:00 via INTRAVENOUS

## 2014-10-17 MED ORDER — NALOXONE HCL 0.4 MG/ML IJ SOLN: 0.4000 mg | INTRAMUSCULAR | Status: DC | PRN

## 2014-10-17 MED ORDER — PHENYLEPHRINE HCL 10 MG/ML IJ SOLN
INTRAMUSCULAR | Status: AC
  Filled 2014-10-17: qty 1

## 2014-10-17 MED ORDER — FENTANYL CITRATE 0.05 MG/ML IJ SOLN
INTRAMUSCULAR | Status: DC | PRN
  Administered 2014-10-17: 12.5 ug via INTRATHECAL

## 2014-10-17 MED ORDER — LACTATED RINGERS IV SOLN
INTRAVENOUS | Status: DC
  Administered 2014-10-17 (×2): via INTRAVENOUS

## 2014-10-17 MED ORDER — OXYCODONE-ACETAMINOPHEN 5-325 MG PO TABS
1.0000 | ORAL_TABLET | ORAL | Status: DC | PRN
  Administered 2014-10-19: 1 via ORAL
  Filled 2014-10-17: qty 1

## 2014-10-17 MED ORDER — SENNOSIDES-DOCUSATE SODIUM 8.6-50 MG PO TABS
2.0000 | ORAL_TABLET | ORAL | Status: DC
  Administered 2014-10-18 – 2014-10-20 (×3): 2 via ORAL
  Filled 2014-10-17 (×3): qty 2

## 2014-10-17 MED ORDER — IBUPROFEN 600 MG PO TABS
600.0000 mg | ORAL_TABLET | Freq: Four times a day (QID) | ORAL | Status: DC
  Administered 2014-10-17 – 2014-10-20 (×11): 600 mg via ORAL
  Filled 2014-10-17 (×11): qty 1

## 2014-10-17 MED ORDER — SIMETHICONE 80 MG PO CHEW
80.0000 mg | CHEWABLE_TABLET | Freq: Three times a day (TID) | ORAL | Status: DC
  Administered 2014-10-17 – 2014-10-20 (×7): 80 mg via ORAL
  Filled 2014-10-17 (×7): qty 1

## 2014-10-17 MED ORDER — KETOROLAC TROMETHAMINE 30 MG/ML IJ SOLN
30.0000 mg | Freq: Four times a day (QID) | INTRAMUSCULAR | Status: DC | PRN
  Administered 2014-10-17: 30 mg via INTRAMUSCULAR

## 2014-10-17 MED ORDER — INFLUENZA VAC SPLIT QUAD 0.5 ML IM SUSY: 0.5000 mL | PREFILLED_SYRINGE | INTRAMUSCULAR | Status: DC

## 2014-10-17 MED ORDER — ONDANSETRON HCL 4 MG PO TABS: 4.0000 mg | ORAL_TABLET | ORAL | Status: DC | PRN

## 2014-10-17 MED ORDER — KETOROLAC TROMETHAMINE 30 MG/ML IJ SOLN: 30.0000 mg | Freq: Four times a day (QID) | INTRAMUSCULAR | Status: DC | PRN

## 2014-10-17 MED ORDER — OXYCODONE-ACETAMINOPHEN 5-325 MG PO TABS: 2.0000 | ORAL_TABLET | ORAL | Status: DC | PRN

## 2014-10-17 MED ORDER — PHENYLEPHRINE 8 MG IN D5W 100 ML (0.08MG/ML) PREMIX OPTIME
INJECTION | INTRAVENOUS | Status: DC | PRN
  Administered 2014-10-17: 60 ug/min via INTRAVENOUS

## 2014-10-17 MED ORDER — NALOXONE HCL 1 MG/ML IJ SOLN
1.0000 ug/kg/h | INTRAMUSCULAR | Status: DC | PRN
  Filled 2014-10-17: qty 2

## 2014-10-17 MED ORDER — SODIUM CHLORIDE 0.9 % IJ SOLN: 3.0000 mL | INTRAMUSCULAR | Status: DC | PRN

## 2014-10-17 MED ORDER — FENTANYL CITRATE 0.05 MG/ML IJ SOLN
INTRAMUSCULAR | Status: AC
  Filled 2014-10-17: qty 2

## 2014-10-17 MED ORDER — TETANUS-DIPHTH-ACELL PERTUSSIS 5-2.5-18.5 LF-MCG/0.5 IM SUSP: 0.5000 mL | Freq: Once | INTRAMUSCULAR | Status: DC

## 2014-10-17 MED ORDER — SCOPOLAMINE 1 MG/3DAYS TD PT72
MEDICATED_PATCH | TRANSDERMAL | Status: AC
  Administered 2014-10-17: 1.5 mg via TRANSDERMAL
  Filled 2014-10-17: qty 1

## 2014-10-17 MED ORDER — HYDROMORPHONE HCL 1 MG/ML IJ SOLN: 0.2500 mg | INTRAMUSCULAR | Status: DC | PRN

## 2014-10-17 MED ORDER — LACTATED RINGERS IV SOLN
Freq: Once | INTRAVENOUS | Status: AC
  Administered 2014-10-17: 10:00:00 via INTRAVENOUS

## 2014-10-17 MED ORDER — DIPHENHYDRAMINE HCL 25 MG PO CAPS: 25.0000 mg | ORAL_CAPSULE | Freq: Four times a day (QID) | ORAL | Status: DC | PRN

## 2014-10-17 MED ORDER — ONDANSETRON HCL 4 MG/2ML IJ SOLN
INTRAMUSCULAR | Status: DC | PRN
  Administered 2014-10-17: 4 mg via INTRAVENOUS

## 2014-10-17 SURGICAL SUPPLY — 37 items
CLAMP CORD UMBIL (MISCELLANEOUS) IMPLANT
CLOTH BEACON ORANGE TIMEOUT ST (SAFETY) ×3 IMPLANT
DRAPE SHEET LG 3/4 BI-LAMINATE (DRAPES) IMPLANT
DRSG OPSITE POSTOP 4X10 (GAUZE/BANDAGES/DRESSINGS) ×3 IMPLANT
DURAPREP 26ML APPLICATOR (WOUND CARE) ×3 IMPLANT
ELECT REM PT RETURN 9FT ADLT (ELECTROSURGICAL) ×3
ELECTRODE REM PT RTRN 9FT ADLT (ELECTROSURGICAL) ×1 IMPLANT
EXTRACTOR VACUUM M CUP 4 TUBE (SUCTIONS) ×2 IMPLANT
EXTRACTOR VACUUM M CUP 4' TUBE (SUCTIONS) ×1
GLOVE BIO SURGEON STRL SZ8 (GLOVE) ×3 IMPLANT
GLOVE BIO SURGEON STRL SZ8.5 (GLOVE) ×3 IMPLANT
GLOVE BIOGEL PI IND STRL 7.0 (GLOVE) ×3 IMPLANT
GLOVE BIOGEL PI INDICATOR 7.0 (GLOVE) ×6
GLOVE SURG SS PI 7.0 STRL IVOR (GLOVE) ×3 IMPLANT
GOWN STRL REUS W/TWL 2XL LVL3 (GOWN DISPOSABLE) ×3 IMPLANT
GOWN STRL REUS W/TWL LRG LVL3 (GOWN DISPOSABLE) ×3 IMPLANT
KIT ABG SYR 3ML LUER SLIP (SYRINGE) IMPLANT
LIQUID BAND (GAUZE/BANDAGES/DRESSINGS) ×3 IMPLANT
NEEDLE HYPO 25X5/8 SAFETYGLIDE (NEEDLE) ×3 IMPLANT
NS IRRIG 1000ML POUR BTL (IV SOLUTION) ×3 IMPLANT
PACK C SECTION WH (CUSTOM PROCEDURE TRAY) ×3 IMPLANT
PAD OB MATERNITY 4.3X12.25 (PERSONAL CARE ITEMS) ×3 IMPLANT
STAPLER VISISTAT 35W (STAPLE) IMPLANT
SUT CHROMIC 0 CT 802H (SUTURE) ×3 IMPLANT
SUT CHROMIC 0 MO4 CR (SUTURE) IMPLANT
SUT CHROMIC 1 CTX 36 (SUTURE) ×12 IMPLANT
SUT CHROMIC 2 0 SH (SUTURE) ×3 IMPLANT
SUT GUT PLAIN 0 CT-3 TAN 27 (SUTURE) IMPLANT
SUT MON AB 4-0 PS1 27 (SUTURE) ×3 IMPLANT
SUT PDS AB 0 CTX 36 PDP370T (SUTURE) IMPLANT
SUT VIC AB 0 CT1 18XCR BRD8 (SUTURE) IMPLANT
SUT VIC AB 0 CT1 8-18 (SUTURE)
SUT VIC AB 0 CTX 36 (SUTURE) ×4
SUT VIC AB 0 CTX36XBRD ANBCTRL (SUTURE) ×2 IMPLANT
TOWEL OR 17X24 6PK STRL BLUE (TOWEL DISPOSABLE) ×3 IMPLANT
TRAY FOLEY CATH 14FR (SET/KITS/TRAYS/PACK) ×3 IMPLANT
WATER STERILE IRR 1000ML POUR (IV SOLUTION) ×3 IMPLANT

## 2014-10-17 NOTE — Transfer of Care (Signed)
Immediate Anesthesia Transfer of Care Note  Patient: [name]  Procedure(s) Performed: Procedure(s): REPEAT CESAREAN SECTION (N/A)  Patient Location: PACU  Anesthesia Type:Spinal  Level of Consciousness: awake, alert , oriented and patient cooperative  Airway & Oxygen Therapy: Patient Spontanous Breathing  Post-op Assessment: Report given to PACU RN and Post -op Vital signs reviewed and stable  Post vital signs: Reviewed and stable  Complications: No apparent anesthesia complications

## 2014-10-17 NOTE — Anesthesia Postprocedure Evaluation (Signed)
Anesthesia Post Note  Patient: [name]  Procedure(s) Performed: Procedure(s) (LRB): REPEAT CESAREAN SECTION (N/A)  Anesthesia type: Spinal  Patient location: PACU  Post pain: Pain level controlled  Post assessment: Post-op Vital signs reviewed  Last Vitals:  Filed Vitals:   10/17/14 1330  BP: 136/86  Pulse: 80  Temp:   Resp: 20    Post vital signs: Reviewed  Level of consciousness: awake  Complications: No apparent anesthesia complications

## 2014-10-17 NOTE — Anesthesia Procedure Notes (Signed)
Spinal Patient location during procedure: OR Start time: 10/17/2014 11:19 AM End time: 10/17/2014 11:21 AM Staffing Anesthesiologist: Leilani AbleHATCHETT, Hannalee Castor Performed by: anesthesiologist  Preanesthetic Checklist Completed: patient identified, surgical consent, pre-op evaluation, timeout performed, IV checked, risks and benefits discussed and monitors and equipment checked Spinal Block Patient position: sitting Prep: site prepped and draped and DuraPrep Patient monitoring: heart rate, cardiac monitor, continuous pulse ox and blood pressure Approach: midline Location: L3-4 Injection technique: single-shot Needle Needle type: Pencan  Needle gauge: 24 G Needle length: 9 cm Needle insertion depth: 5 cm Assessment Sensory level: T4

## 2014-10-17 NOTE — Op Note (Signed)
Preop diagnosis previous cesarean section at term desires repeat Postop diagnosis same Surgeon Dr. Francoise CeoBernard Thurlow Hamilton First assistant Dr. Coral Ceoharles Harper Anesthesia spinal Procedure patient placed on the operating table in the supine position abdomen prepped and draped data entered with a Foley catheter a transverse incision made through the old scar carried down to the rectus fascia fascia cleaned and incised the length of the incision recti muscles retracted laterally peritoneum incised longitudinally transverse incision made in the bladder flap and the bladder mobilized inferiorly transverse lower uterine incision made fluid was clear patient delivered with vacuum a female Apgar 9 and 9 placenta posterior fundal removed manually and sent to labor and delivery uterine cavity clean with dry laps uterine incision closed in one layer with continuous suture of #1 chromic hemostasis satisfactory abdomen closed in layers peritoneum continuous double chromic fascia continuous with of 0 Dexon and the skin closed with subcuticular stitch of 4-0 Monocryl blood loss 700 cc patient tolerated procedure well

## 2014-10-17 NOTE — H&P (Signed)
  There has been no change in her history and physical since the original dictation 

## 2014-10-17 NOTE — Anesthesia Preprocedure Evaluation (Signed)
Anesthesia Evaluation  Patient identified by MRN, date of birth, ID band Patient awake    Reviewed: Allergy & Precautions, H&P , NPO status , Patient's Chart, lab work & pertinent test results  Airway Mallampati: II  TM Distance: >3 FB Neck ROM: full    Dental no notable dental hx.    Pulmonary neg pulmonary ROS,    Pulmonary exam normal       Cardiovascular negative cardio ROS      Neuro/Psych negative neurological ROS  negative psych ROS   GI/Hepatic negative GI ROS, Neg liver ROS,   Endo/Other  negative endocrine ROS  Renal/GU negative Renal ROS     Musculoskeletal   Abdominal (+) + obese,   Peds  Hematology negative hematology ROS (+)   Anesthesia Other Findings   Reproductive/Obstetrics (+) Pregnancy                             Anesthesia Physical Anesthesia Plan  ASA: II  Anesthesia Plan: Spinal   Post-op Pain Management:    Induction:   Airway Management Planned:   Additional Equipment:   Intra-op Plan:   Post-operative Plan:   Informed Consent: I have reviewed the patients History and Physical, chart, labs and discussed the procedure including the risks, benefits and alternatives for the proposed anesthesia with the patient or authorized representative who has indicated his/her understanding and acceptance.     Plan Discussed with: CRNA and Surgeon  Anesthesia Plan Comments:         Anesthesia Quick Evaluation

## 2014-10-17 NOTE — Progress Notes (Signed)
Peds in attendance: Asked to attend the repeat c-section delivery of this 39 week black female infant.  Mom's pregnancy was uneventful. Infant presented with spontaneous cry.  Placed in warmer, dried and bulb suctioned..  No gross abnormalities seen. Apgars 9 (1 min) and 9 (at 5 mins).  Term infant stable in room air, skin to skin with mom.  HOLT, HARRIETT T, RN, NNP-BC

## 2014-10-18 ENCOUNTER — Encounter (HOSPITAL_COMMUNITY): Payer: Self-pay | Admitting: Obstetrics

## 2014-10-18 LAB — CBC
HEMATOCRIT: 30.3 % — AB (ref 36.0–46.0)
Hemoglobin: 10.2 g/dL — ABNORMAL LOW (ref 12.0–15.0)
MCH: 30.1 pg (ref 26.0–34.0)
MCHC: 33.7 g/dL (ref 30.0–36.0)
MCV: 89.4 fL (ref 78.0–100.0)
Platelets: 232 10*3/uL (ref 150–400)
RBC: 3.39 MIL/uL — AB (ref 3.87–5.11)
RDW: 13.1 % (ref 11.5–15.5)
WBC: 8.8 10*3/uL (ref 4.0–10.5)

## 2014-10-18 NOTE — Progress Notes (Signed)
Patient ID: [name], female   DOB: 12-14-1990, 23 y.o.   MRN: [id number] Postop day 1 Vital signs normal Fundus firm Lochia moderate Legs negative

## 2014-10-18 NOTE — Anesthesia Postprocedure Evaluation (Signed)
  Anesthesia Post-op Note  Patient: [name]  Procedure(s) Performed: Procedure(s): REPEAT CESAREAN SECTION (N/A)  Patient Location: Mother/Baby  Anesthesia Type:Spinal  Level of Consciousness: awake, alert , oriented and patient cooperative  Airway and Oxygen Therapy: Patient Spontanous Breathing  Post-op Pain: mild  Post-op Assessment: Patient's Cardiovascular Status Stable, Respiratory Function Stable, No headache, No backache, No residual numbness and No residual motor weakness  Post-op Vital Signs: stable  Last Vitals:  Filed Vitals:   10/18/14 0628  BP: 131/86  Pulse: 72  Temp: 36.6 C  Resp: 20    Complications: No apparent anesthesia complications

## 2014-10-18 NOTE — Progress Notes (Signed)
Pt B/P tonight was 141/93, education done about signs and symptoms of hypertension and handout given from new S/S hypertension tool handout.

## 2014-10-18 NOTE — Addendum Note (Signed)
Addendum  created 10/18/14 [id number] by Earmon PhoenixValerie P Brittinie Wherley, CRNA   Modules edited: Notes Section   Notes Section:  File: [id number]

## 2014-10-19 MED ORDER — LABETALOL HCL 200 MG PO TABS
200.0000 mg | ORAL_TABLET | Freq: Two times a day (BID) | ORAL | Status: DC
  Administered 2014-10-19 – 2014-10-20 (×2): 200 mg via ORAL
  Filled 2014-10-19 (×2): qty 1

## 2014-10-19 NOTE — Progress Notes (Signed)
Patient ID: [name], female   DOB: [birth year], 23 y.o.   MRN: [id number] Postop day 2 Vital signs normal Fundus firm firm Lochia moderate Legs negative doing well

## 2014-10-20 NOTE — Progress Notes (Signed)
Patient ID: [name], female   DOB: [birth year], 24 y.o.   MRN: [id number] Postop day 3 Vital signs normal yesterday her blood pressures were elevated and she was started on labetalol 200 by mouth twice a day blood pressure normal today she is discharged today and then see me on Thursday for blood pressure check

## 2014-10-20 NOTE — Discharge Summary (Signed)
Obstetric Discharge Summary Reason for Admission: onset of labor and cesarean section Prenatal Procedures: none Intrapartum Procedures: cesarean: low cervical, transverse Postpartum Procedures: none Complications-Operative and Postpartum: none HEMOGLOBIN  Date Value Ref Range Status  10/18/2014 10.2* 12.0 - 15.0 g/dL Final   HCT  Date Value Ref Range Status  10/18/2014 30.3* 36.0 - 46.0 % Final    Physical Exam:  General: alert Lochia: appropriate Uterine Fundus: firm Incision: healing well DVT Evaluation: No evidence of DVT seen on physical exam.  Discharge Diagnoses: Term Pregnancy-delivered  Discharge Information: Date: 10/20/2014 Activity: pelvic rest Diet: routine Medications: Percocet Condition: improved Instructions: refer to practice specific booklet Discharge to: home Follow-up Information    Follow up with MARSHALL,BERNARD A, MD In 7 days.   Specialty:  Obstetrics and Gynecology   Contact information:   [address] [place] [address] [phone]       Newborn Data: Live born female  Birth Weight: 6 lb 13 oz (3090 g) APGAR: 9, 9  Home with mother.  MARSHALL,BERNARD A 10/20/2014, 6:50 AM

## 2014-10-20 NOTE — Discharge Instructions (Signed)
Discharge instructions ° °· You can wash your hair °· Shower °· Eat what you want °· Drink what you want °· See me in 6 weeks °· Your ankles are going to swell more in the next 2 weeks than when pregnant °· No sex for 6 weeks ° ° °[name] A, MD 10/20/2014 ° ° °

## 2014-10-20 NOTE — Progress Notes (Signed)
Clinical Social Work Department BRIEF PSYCHOSOCIAL ASSESSMENT 10/20/2014  Patient:  [name],[name]     Account Number:  [id number]     Admit date:  10/17/2014  Clinical Social Worker:  Daniell Mancinas, LCSW  Date/Time:  10/20/2014 10:30 AM  Referred by:  RN  Date Referred:  10/18/2014 Referred for  Other - See comment   Other Referral:   [facility]   Interview type:  Family Other interview type:    PSYCHOSOCIAL DATA Living Status:  FAMILY Admitted from facility:   Level of care:   Primary support name:  Jerry Gant Primary support relationship to patient:  PARTNER Degree of support available:   Couple lives with FOB's mother Cherelle Garner    CURRENT CONCERNS Current Concerns  None Noted   Other Concerns:    SOCIAL WORK ASSESSMENT / PLAN CSW met with MOB and FOB in MOB's first floor room to complete assessment for LPNC at 31 weeks.  CSW discussed supports, supplies and informed of hospital drug screen policy.  CSW concerned about the possibility of somewhat limited cognition from conversing with parents, but nothing is documented in PNR, concerns not noted by staff and FOB states they live with his mother.  CSW concerned that MOB did not know when her 8 year old daughter went to live with her grandmother, but cannot verify whether or not custody was removed given that today is a holiday.  CSW feels baby may discharge since no concerns from hospitalization are noted and parents have support in the home.   Assessment/plan status:   Other assessment/ plan:   CSW plans to follow up with [place] CPS on Monday to inquire about MOB's 26 year old.   Information/referral to community resources:   CSW will make CC4C for added support in the home.    PATIENT'S/FAMILY'S RESPONSE TO PLAN OF CARE: Parents were agreeable to CSW's visit.  FOB laid on his stomach on the couch looking at baby in the car seat while CSW completed assessment.  He was pleasant and would answer questions when asked.   MOB's affect was flat and her speech was somewhat difficult to understand.  She states she did not find out that she was pregnant until she was 7 months along.  She had no concerns about hospital drug screen policy.  She reports she has one other child, who lives with MOB's grandmother.  She could not remember any of the details of when her daughter went to live with her grandmother.  She does not know who her daughter's pediatrician is, but is aware that she will be taking her baby to [facility] on [address], and states no issues with transportation.  Parents report having everything they need for baby at home and that they have a good support system.  They state they feel prepared for baby.  They state no questions or concerns prior to discharge today.     

## 2016-05-08 ENCOUNTER — Encounter (HOSPITAL_COMMUNITY): Payer: Self-pay | Admitting: Emergency Medicine

## 2016-05-08 ENCOUNTER — Emergency Department (HOSPITAL_COMMUNITY): Payer: Medicaid Other

## 2016-05-08 ENCOUNTER — Emergency Department (HOSPITAL_COMMUNITY)
Admission: EM | Admit: 2016-05-08 | Discharge: 2016-05-08 | Disposition: A | Discharge status: Home / Self Care | Payer: Medicaid Other | Attending: Emergency Medicine | Admitting: Emergency Medicine

## 2016-05-08 DIAGNOSIS — Y939 Activity, unspecified: Secondary | ICD-10-CM | POA: Insufficient documentation

## 2016-05-08 DIAGNOSIS — Y999 Unspecified external cause status: Secondary | ICD-10-CM | POA: Insufficient documentation

## 2016-05-08 DIAGNOSIS — X58XXXA Exposure to other specified factors, initial encounter: Secondary | ICD-10-CM | POA: Insufficient documentation

## 2016-05-08 DIAGNOSIS — S93401A Sprain of unspecified ligament of right ankle, initial encounter: Secondary | ICD-10-CM | POA: Insufficient documentation

## 2016-05-08 DIAGNOSIS — S92151A Displaced avulsion fracture (chip fracture) of right talus, initial encounter for closed fracture: Secondary | ICD-10-CM

## 2016-05-08 DIAGNOSIS — Y929 Unspecified place or not applicable: Secondary | ICD-10-CM | POA: Diagnosis not present

## 2016-05-08 DIAGNOSIS — S99911A Unspecified injury of right ankle, initial encounter: Secondary | ICD-10-CM | POA: Diagnosis present

## 2016-05-08 MED ORDER — IBUPROFEN 600 MG PO TABS: 600.0000 mg | ORAL_TABLET | Freq: Four times a day (QID) | ORAL | Status: DC | PRN

## 2016-05-08 MED ORDER — IBUPROFEN 400 MG PO TABS
600.0000 mg | ORAL_TABLET | Freq: Once | ORAL | Status: AC
  Administered 2016-05-08: 600 mg via ORAL
  Filled 2016-05-08: qty 1

## 2016-05-08 MED ORDER — HYDROCODONE-ACETAMINOPHEN 5-325 MG PO TABS
1.0000 | ORAL_TABLET | Freq: Once | ORAL | Status: AC
  Administered 2016-05-08: 1 via ORAL
  Filled 2016-05-08: qty 1

## 2016-05-08 MED ORDER — HYDROCODONE-ACETAMINOPHEN 5-325 MG PO TABS: 1.0000 | ORAL_TABLET | ORAL | Status: DC | PRN

## 2016-05-08 NOTE — ED Provider Notes (Signed)
CSN: [id number]     Arrival date & time 05/08/16  1151 History  By signing my name below, I, [name], attest that this documentation has been prepared under the direction and in the presence of [name] Y [name], [place].  Electronically Signed: Gillis Hamilton. Lyn Hollingshead, ED Scribe. 05/08/2016. 12:20 PM.     Chief Complaint  Patient presents with  . Ankle Injury   The history is provided by the patient. No language interpreter was used.   HPI Comments: [name] is a 25 y.o. female who presents to the Emergency Department complaining of sudden onset, constant, moderate, right ankle pain s/p injury x 1 day. Pt states she was playing and think "she twisted it." Pt has associated swelling, numbness, and tingling of her right foot. Pain is exacerbated with walking. No alleviating factors noted. Denies any other complaints at this time.   Past Medical History  Diagnosis Date  . Medical history non-contributory    Past Surgical History  Procedure Laterality Date  . Cesarean section    . Cesarean section N/A 10/17/2014    Procedure: REPEAT CESAREAN SECTION;  Surgeon: Judy Cosier, MD;  Location: [facility];  Service: Obstetrics;  Laterality: N/A;   No family history on file. Social History  Substance Use Topics  . Smoking status: Never Smoker   . Smokeless tobacco: Never Used  . Alcohol Use: Yes   OB History    Gravida Para Term Preterm AB TAB SAB Ectopic Multiple Living   0 2     Review of Systems 10 Systems reviewed and all are negative for acute change except as noted in the HPI.  Allergies  Review of patient's allergies indicates no known allergies.  Home Medications   Prior to Admission medications   Not on File   BP 112/77 mmHg  Pulse 93  Temp(Src) 98.2 F (36.8 C) (Oral)  Resp 20  Ht  (1.549 m)  Wt 195 lb (88.451 kg)  BMI 36.86 kg/m2  SpO2 98%  LMP 05/03/2016 Physical Exam  Constitutional: She is oriented to person, place, and time.  She appears well-developed and well-nourished. No distress.  HENT:  Head: Normocephalic and atraumatic.  Eyes: Conjunctivae are normal.  Cardiovascular: Normal rate and intact distal pulses.   Brisk cap refill. 2+ DP  Pulmonary/Chest: Effort normal.  Abdominal: She exhibits no distension.  Musculoskeletal: She exhibits edema and tenderness.  Right ankle with medial swelling that extends to proximal dorsum of foot. Tender.   Neurological: She is alert and oriented to person, place, and time.  Skin: Skin is warm and dry. No erythema.  No overlying erythema or discoloration.    Psychiatric: She has a normal mood and affect.  Nursing note and vitals reviewed.   ED Course  Procedures (including critical care time) DIAGNOSTIC STUDIES: Oxygen Saturation is 98% on RA, normal by my interpretation.    COORDINATION OF CARE: 12:11 PM-Discussed treatment plan which includes right ankle X-ray and order of Ibuprofen with pt at bedside and pt agreed to plan.   Imaging Review Dg Ankle Complete Right  05/08/2016  CLINICAL DATA:  Twisting injury right ankle last night with medial pain. Swelling. Initial encounter. EXAM: RIGHT ANKLE - COMPLETE 3+ VIEW COMPARISON:  None. FINDINGS: A tiny ossific fragment is seen off the dorsal margin of the talar neck which could be due to an avulsion fracture. Plantar calcaneal spur is noted. Imaged bones otherwise appear normal. Soft  tissues are unremarkable. IMPRESSION: Possible tiny avulsion fracture off the dorsal neck of the talus. No other acute abnormality. Electronically Signed   By: Judy Kannerhomas  Hamilton M.D.   On: 05/08/2016 12:55   I have personally reviewed and evaluated these images and lab results as part of my medical decision-making.  MDM   Final diagnoses:  Right ankle sprain, initial encounter  Avulsion fracture of talus, right, closed, initial encounter    X-ray with questionable tiny avulsion fracture off dorsal talus. Ankle brace placed and  crutches given. Encouraged close ortho f/u. Otherwise neurovascularly intact. ER return precautions given.   I personally performed the services described in this documentation, which was scribed in my presence. The recorded information has been reviewed and is accurate.   Judy CoriaSerena Y Salvatrice Morandi, PA-C 05/09/16 1253  Maia PlanJoshua G Long, MD 05/09/16 [phone]

## 2016-05-08 NOTE — Discharge Instructions (Signed)
Please follow up with Dr. Shon BatonBrooks as soon as possible. Return to the ER for new or worsening symptoms.

## 2016-05-08 NOTE — ED Notes (Signed)
Patient states hurt R ankle last night, but unsure how she hurt it.   Patient states didn't take anything for pain at home.   Denies other problems.

## 2016-05-08 NOTE — Progress Notes (Signed)
Orthopedic Tech Progress Note Patient Details:  Suzan SlickChiqute L Burek 01/30/1991 [id number]  Ortho Devices Type of Ortho Device: ASO   Saul FordyceJennifer C Deaunte Dente 05/08/2016, 1:45 PM

## 2017-08-13 ENCOUNTER — Encounter (HOSPITAL_COMMUNITY): Payer: Self-pay | Admitting: Student

## 2017-08-13 ENCOUNTER — Encounter (HOSPITAL_COMMUNITY): Payer: Self-pay | Admitting: *Deleted

## 2017-08-13 ENCOUNTER — Inpatient Hospital Stay (HOSPITAL_COMMUNITY)
Admission: AD | Admit: 2017-08-13 | Discharge: 2017-08-13 | Disposition: A | Discharge status: Home / Self Care | Payer: Medicaid Other | Source: Ambulatory Visit | Attending: Obstetrics and Gynecology | Admitting: Obstetrics and Gynecology

## 2017-08-13 ENCOUNTER — Emergency Department (HOSPITAL_COMMUNITY)
Admission: EM | Admit: 2017-08-13 | Discharge: 2017-08-13 | Disposition: A | Discharge status: Home / Self Care | Payer: Medicaid Other | Attending: Emergency Medicine | Admitting: Emergency Medicine

## 2017-08-13 DIAGNOSIS — X503XXA Overexertion from repetitive movements, initial encounter: Secondary | ICD-10-CM | POA: Diagnosis not present

## 2017-08-13 DIAGNOSIS — S46912A Strain of unspecified muscle, fascia and tendon at shoulder and upper arm level, left arm, initial encounter: Secondary | ICD-10-CM | POA: Diagnosis not present

## 2017-08-13 DIAGNOSIS — Y939 Activity, unspecified: Secondary | ICD-10-CM | POA: Diagnosis not present

## 2017-08-13 DIAGNOSIS — Z3A00 Weeks of gestation of pregnancy not specified: Secondary | ICD-10-CM | POA: Insufficient documentation

## 2017-08-13 DIAGNOSIS — Y990 Civilian activity done for income or pay: Secondary | ICD-10-CM | POA: Insufficient documentation

## 2017-08-13 DIAGNOSIS — O219 Vomiting of pregnancy, unspecified: Secondary | ICD-10-CM | POA: Insufficient documentation

## 2017-08-13 DIAGNOSIS — Y9289 Other specified places as the place of occurrence of the external cause: Secondary | ICD-10-CM | POA: Diagnosis not present

## 2017-08-13 DIAGNOSIS — Z3491 Encounter for supervision of normal pregnancy, unspecified, first trimester: Secondary | ICD-10-CM | POA: Insufficient documentation

## 2017-08-13 DIAGNOSIS — Z3201 Encounter for pregnancy test, result positive: Secondary | ICD-10-CM | POA: Diagnosis not present

## 2017-08-13 DIAGNOSIS — S4992XA Unspecified injury of left shoulder and upper arm, initial encounter: Secondary | ICD-10-CM | POA: Diagnosis present

## 2017-08-13 DIAGNOSIS — O3680X0 Pregnancy with inconclusive fetal viability, not applicable or unspecified: Secondary | ICD-10-CM | POA: Diagnosis present

## 2017-08-13 LAB — URINALYSIS, ROUTINE W REFLEX MICROSCOPIC
BILIRUBIN URINE: NEGATIVE
GLUCOSE, UA: NEGATIVE mg/dL
HGB URINE DIPSTICK: NEGATIVE
Ketones, ur: NEGATIVE mg/dL
Leukocytes, UA: NEGATIVE
Nitrite: NEGATIVE
PH: 6 (ref 5.0–8.0)
Protein, ur: NEGATIVE mg/dL
SPECIFIC GRAVITY, URINE: 1.01 (ref 1.005–1.030)

## 2017-08-13 LAB — POC URINE PREG, ED: Preg Test, Ur: POSITIVE — AB

## 2017-08-13 MED ORDER — PRENATAL COMPLETE 14-0.4 MG PO TABS: 1.0000 | ORAL_TABLET | Freq: Two times a day (BID) | ORAL | 8 refills | Status: DC

## 2017-08-13 NOTE — ED Provider Notes (Signed)
[facility] EMERGENCY DEPARTMENT Provider Note   CSN: [id number] Arrival date & time: 08/13/17  0700     History   Chief Complaint Chief Complaint  Patient presents with  . Shoulder Pain  . possible pregnant    HPI [name] is a 26 y.o. female.  HPI  Patient comes to the ER for left shoulder pain and would like a pregnancy test. 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 She also requests a pregnancy test because she last had her menstrual cycle two moths ago. She is otherwise asymptomatic. No CP, SOB, le swelling, coughing, fevers, weakness, vaginal discharge or bleeding. No dysuria or abdominal pain  Past Medical History:  Diagnosis Date  . Medical history non-contributory     Patient Active Problem List   Diagnosis Date Noted  . S/P cesarean section 10/17/2014    Past Surgical History:  Procedure Laterality Date  . CESAREAN SECTION    . CESAREAN SECTION N/A 10/17/2014   Procedure: REPEAT CESAREAN SECTION;  Surgeon: Kathreen Cosier, MD;  Location: [facility];  Service: Obstetrics;  Laterality: N/A;    OB History    Gravida Para Term Preterm AB Living   2 2 2     2    SAB TAB Ectopic Multiple Live Births         0 2       Home Medications    Prior to Admission medications   Medication Sig Start Date End Date Taking? Authorizing Provider  HYDROcodone-acetaminophen (NORCO/VICODIN) 5-325 MG tablet Take 1-2 tablets by mouth every 4 (four) hours as needed for severe pain. 05/08/16   Sam, Ace Gins, PA-C  ibuprofen (ADVIL,MOTRIN) 600 MG tablet Take 1 tablet (600 mg total) by mouth every 6 (six) hours as needed. 05/08/16   Sam, Ace Gins, PA-C  Prenatal Vit-Fe Fumarate-FA (PRENATAL COMPLETE) 14-0.4 MG TABS Take 1 tablet by mouth 2 (two) times daily. 08/13/17   Marlon Pel, PA-C    Family History No family history on file.  Social History Social History  Substance Use  Topics  . Smoking status: Never Smoker  . Smokeless tobacco: Never Used  . Alcohol use Yes     Allergies   Patient has no known allergies.   Review of Systems Review of Systems Negative ROS aside from pertinent positives and negatives as listed in HPI   Physical Exam Updated Vital Signs BP 126/78 (BP Location: Left Arm)   Pulse 93   Temp 98.3 F (36.8 C) (Oral)   Resp 16   Ht 5\' 1"  (1.549 m)   Wt 79.4 kg (175 lb)   SpO2 100%   BMI 33.07 kg/m   Physical Exam  Constitutional: She appears well-developed and well-nourished.  HENT:  Head: Normocephalic and atraumatic.  Eyes: Pupils are equal, round, and reactive to light. Conjunctivae are normal.  Neck: Trachea normal, normal range of motion and full passive range of motion without pain. Neck supple.  Cardiovascular: Normal rate, regular rhythm and normal pulses.   Pulmonary/Chest: Effort normal and breath sounds normal. Chest wall is not dull to percussion. She exhibits no tenderness, no crepitus, no edema, no deformity and no retraction.  Abdominal: Soft. Normal appearance and bowel sounds are normal.  Musculoskeletal:       Left shoulder: She exhibits decreased range of motion, tenderness and pain. She exhibits no bony tenderness, no effusion, no crepitus, no deformity, no laceration,  no spasm, normal pulse and normal strength.  Neurological: She is alert. She has normal strength.  Skin: Skin is warm, dry and intact.  Psychiatric: She has a normal mood and affect. Her speech is normal and behavior is normal. Judgment and thought content normal. Cognition and memory are normal.     ED Treatments / Results  Labs (all labs ordered are listed, but only abnormal results are displayed) Labs Reviewed  POC URINE PREG, ED - Abnormal; Notable for the following:       Result Value   Preg Test, Ur POSITIVE (*)    All other components within normal limits    EKG  EKG Interpretation None       Radiology No results  found.  Procedures Procedures (including critical care time)  Medications Ordered in ED Medications - No data to display   Initial Impression / Assessment and Plan / ED Course  I have reviewed the triage vital signs and the nursing notes.  Pertinent labs & imaging results that were available during my care of the patient were reviewed by me and considered in my medical decision making (see chart for details).     I recommend she ice, heat, stretch, rest and take Tylenol for her shoulder.  Consvervative measures for now. Positive pregnancy test per RN. Will start on prenatal vitamins and refer to womens to assess for IUP and progression of pregnancy.    Final Clinical Impressions(s) / ED Diagnoses   Final diagnoses:  Strain of left shoulder, initial encounter  Positive pregnancy test    New Prescriptions New Prescriptions   PRENATAL VIT-FE FUMARATE-FA (PRENATAL COMPLETE) 14-0.4 MG TABS    Take 1 tablet by mouth 2 (two) times daily.     Marlon PelGreene, Parilee Hally, PA-C 08/13/17 [id number]    Cathren LaineSteinl, Kevin, MD 08/13/17 1021

## 2017-08-13 NOTE — MAU Note (Signed)
Patient here, "to see how many week I am".  Pt. [facility]. Pt. States she has been vomiting for the past two weeks, vomit x4 in the past 24 hours. Pt. Denies pain, denies vaginal bleeding.

## 2017-08-13 NOTE — MAU Provider Note (Signed)
History     CSN: [id number]  Arrival date and time: 08/13/17 1210  First Provider Initiated Contact with Patient 08/13/17 1350       Chief Complaint  Patient presents with  . Nausea   HPI Laquanta L Woodberry is a 26 y.o. G3P2002 at 6332w1d with unsure LMP who presents with nausea. Was seen in ED today for shoulder pain r/t work & had positive pregnancy test. Presents here requesting dating. Reports nausea & vomiting for the last month. States she vomits 4x per day. Does not have antiemetic. LMP was sometime this summer but doesn't know when. Denies abdominal pain or vaginal bleeding. OB History    Gravida Para Term Preterm AB Living   3 2 2     2    SAB TAB Ectopic Multiple Live Births         0 2      Past Medical History:  Diagnosis Date  . Medical history non-contributory     Past Surgical History:  Procedure Laterality Date  . CESAREAN SECTION    . CESAREAN SECTION N/A 10/17/2014   Procedure: REPEAT CESAREAN SECTION;  Surgeon: Kathreen CosierBernard A Marshall, MD;  Location: [facility];  Service: Obstetrics;  Laterality: N/A;    No family history on file.  Social History  Substance Use Topics  . Smoking status: Never Smoker  . Smokeless tobacco: Never Used  . Alcohol use Yes    Allergies: No Known Allergies  Prescriptions Prior to Admission  Medication Sig Dispense Refill Last Dose  . HYDROcodone-acetaminophen (NORCO/VICODIN) 5-325 MG tablet Take 1-2 tablets by mouth every 4 (four) hours as needed for severe pain. 10 tablet 0   . ibuprofen (ADVIL,MOTRIN) 600 MG tablet Take 1 tablet (600 mg total) by mouth every 6 (six) hours as needed. 30 tablet 0   . Prenatal Vit-Fe Fumarate-FA (PRENATAL COMPLETE) 14-0.4 MG TABS Take 1 tablet by mouth 2 (two) times daily. 60 each 8     Review of Systems  Constitutional: Negative.   Gastrointestinal: Positive for nausea and vomiting. Negative for abdominal pain, constipation and diarrhea.  Genitourinary: Negative for vaginal bleeding.    Physical Exam   Blood pressure 116/67, pulse 84, temperature 98.4 F (36.9 C), temperature source Oral, resp. rate 17, unknown if currently breastfeeding.  Physical Exam  Nursing note and vitals reviewed. Constitutional: She is oriented to person, place, and time. She appears well-developed and well-nourished. No distress.  HENT:  Head: Normocephalic and atraumatic.  Eyes: Conjunctivae are normal. Right eye exhibits no discharge. Left eye exhibits no discharge. No scleral icterus.  Neck: Normal range of motion.  Respiratory: Effort normal. No respiratory distress.  GI: Soft. There is no tenderness.  Neurological: She is alert and oriented to person, place, and time.  Skin: Skin is warm and dry. She is not diaphoretic.  Psychiatric: She has a normal mood and affect. Her behavior is normal. Judgment and thought content normal.    MAU Course  Procedures Results for orders placed or performed during the hospital encounter of 08/13/17 (from the past 48 hour(s))  Urinalysis, Routine w reflex microscopic     Status: None   Collection Time: 08/13/17  1:21 PM  Result Value Ref Range   Color, Urine YELLOW YELLOW   APPearance CLEAR CLEAR   Specific Gravity, Urine 1.010 1.005 - 1.030   pH 6.0 5.0 - 8.0   Glucose, UA NEGATIVE NEGATIVE mg/dL   Hgb urine dipstick NEGATIVE NEGATIVE   Bilirubin Urine NEGATIVE NEGATIVE  Ketones, ur NEGATIVE NEGATIVE mg/dL   Protein, ur NEGATIVE NEGATIVE mg/dL   Nitrite NEGATIVE NEGATIVE   Leukocytes, UA NEGATIVE NEGATIVE    MDM Positive UPT at ED No abdominal pain or vaginal bleeding U/a shows no evidence of dehydration. Patient declines antiemetic  Assessment and Plan  A: 1. Nausea and vomiting during pregnancy   2. Currently pregnant in first trimester with unknown gestational age    P: Discharge home Outpatient ultrasound for dating & viability ordered OB/gyn list given -- pt to start prenatal care Discussed reasons to return to MAU  Judeth Horn 08/13/2017, 1:49 PM

## 2017-08-13 NOTE — ED Triage Notes (Signed)
To ED for eval of left shoulder pain since yesterday afternoon. Pain with movement. Denies injury. No deformity noted. Also requesting a pregnancy test. States her LMP was maybe 2 months ago. No abd pain or vaginal discharge, per pt

## 2017-08-13 NOTE — Discharge Instructions (Signed)
Morning Sickness °Morning sickness is when you feel sick to your stomach (nauseous) during pregnancy. This nauseous feeling may or may not come with vomiting. It often occurs in the morning but can be a problem any time of day. Morning sickness is most common during the first trimester, but it may continue throughout pregnancy. While morning sickness is unpleasant, it is usually harmless unless you develop severe and continual vomiting (hyperemesis gravidarum). This condition requires more intense treatment. °What are the causes? °The cause of morning sickness is not completely known but seems to be related to normal hormonal changes that occur in pregnancy. °What increases the risk? °You are at greater risk if you: °· Experienced nausea or vomiting before your pregnancy. °· Had morning sickness during a previous pregnancy. °· Are pregnant with more than one baby, such as twins. ° °How is this treated? °Do not use any medicines (prescription, over-the-counter, or herbal) for morning sickness without first talking to your health care provider. Your health care provider may prescribe or recommend: °· Vitamin B6 supplements. °· Anti-nausea medicines. °· The herbal medicine ginger. ° °Follow these instructions at home: °· Only take over-the-counter or prescription medicines as directed by your health care provider. °· Taking multivitamins before getting pregnant can prevent or decrease the severity of morning sickness in most women. °· Eat a piece of dry toast or unsalted crackers before getting out of bed in the morning. °· Eat five or six small meals a day. °· Eat dry and bland foods (rice, baked potato). Foods high in carbohydrates are often helpful. °· Do not drink liquids with your meals. Drink liquids between meals. °· Avoid greasy, fatty, and spicy foods. °· Get someone to cook for you if the smell of any food causes nausea and vomiting. °· If you feel nauseous after taking prenatal vitamins, take the vitamins at  night or with a snack. °· Snack on protein foods (nuts, yogurt, cheese) between meals if you are hungry. °· Eat unsweetened gelatins for desserts. °· Wearing an acupressure wristband (worn for sea sickness) may be helpful. °· Acupuncture may be helpful. °· Do not smoke. °· Get a humidifier to keep the air in your house free of odors. °· Get plenty of fresh air. °Contact a health care provider if: °· Your home remedies are not working, and you need medicine. °· You feel dizzy or lightheaded. °· You are losing weight. °Get help right away if: °· You have persistent and uncontrolled nausea and vomiting. °· You pass out (faint). °This information is not intended to replace advice given to you by your health care provider. Make sure you discuss any questions you have with your health care provider. °Document Released: 11/27/2006 Document Revised: 03/13/2016 Document Reviewed: 03/23/2013 °Elsevier Interactive Patient Education © 2017 [employer] ° °

## 2017-08-17 ENCOUNTER — Encounter: Payer: Self-pay | Admitting: Obstetrics and Gynecology

## 2017-08-17 ENCOUNTER — Ambulatory Visit (HOSPITAL_COMMUNITY)
Admission: RE | Admit: 2017-08-17 | Discharge: 2017-08-17 | Disposition: A | Discharge status: Home / Self Care | Payer: Medicaid Other | Source: Ambulatory Visit | Attending: Student | Admitting: Student

## 2017-08-17 ENCOUNTER — Ambulatory Visit: Payer: Medicaid Other

## 2017-08-17 DIAGNOSIS — Z712 Person consulting for explanation of examination or test findings: Secondary | ICD-10-CM

## 2017-08-17 DIAGNOSIS — Z3491 Encounter for supervision of normal pregnancy, unspecified, first trimester: Secondary | ICD-10-CM | POA: Insufficient documentation

## 2017-08-17 DIAGNOSIS — Z3A09 9 weeks gestation of pregnancy: Secondary | ICD-10-CM | POA: Insufficient documentation

## 2017-08-20 NOTE — Progress Notes (Signed)
Pt here today for OB US results.  Pt informed that she has good pregnancy that she can start prenatal care.  Proof of pregnancy letter given from the front office.  Meds safe to take in pregnancy given to pt.  Pt did not have any other questions.

## 2017-09-15 LAB — OB RESULTS CONSOLE ABO/RH: RH Type: POSITIVE

## 2017-09-15 LAB — OB RESULTS CONSOLE RUBELLA ANTIBODY, IGM: RUBELLA: IMMUNE

## 2017-09-15 LAB — OB RESULTS CONSOLE GC/CHLAMYDIA
CHLAMYDIA, DNA PROBE: NEGATIVE
GC PROBE AMP, GENITAL: NEGATIVE

## 2017-09-15 LAB — OB RESULTS CONSOLE RPR: RPR: NONREACTIVE

## 2017-09-15 LAB — OB RESULTS CONSOLE ANTIBODY SCREEN: ANTIBODY SCREEN: NEGATIVE

## 2017-09-15 LAB — OB RESULTS CONSOLE HEPATITIS B SURFACE ANTIGEN: Hepatitis B Surface Ag: NEGATIVE

## 2017-09-15 LAB — OB RESULTS CONSOLE HIV ANTIBODY (ROUTINE TESTING): HIV: NONREACTIVE

## 2018-01-25 ENCOUNTER — Other Ambulatory Visit: Payer: Self-pay | Admitting: Obstetrics and Gynecology

## 2018-02-19 LAB — OB RESULTS CONSOLE GBS: GBS: NEGATIVE

## 2018-03-05 ENCOUNTER — Telehealth (HOSPITAL_COMMUNITY): Payer: Self-pay | Admitting: *Deleted

## 2018-03-05 NOTE — Telephone Encounter (Signed)
Preadmission screen  

## 2018-03-08 ENCOUNTER — Telehealth (HOSPITAL_COMMUNITY): Payer: Self-pay | Admitting: *Deleted

## 2018-03-08 NOTE — Telephone Encounter (Signed)
Preadmission screen  

## 2018-03-10 ENCOUNTER — Telehealth (HOSPITAL_COMMUNITY): Payer: Self-pay | Admitting: *Deleted

## 2018-03-10 ENCOUNTER — Encounter (HOSPITAL_COMMUNITY): Payer: Self-pay

## 2018-03-10 NOTE — Telephone Encounter (Signed)
Preadmission screen  

## 2018-03-16 ENCOUNTER — Encounter (HOSPITAL_COMMUNITY)
Admission: RE | Admit: 2018-03-16 | Discharge: 2018-03-16 | Disposition: A | Discharge status: Home / Self Care | Payer: Medicaid Other | Source: Ambulatory Visit | Attending: Obstetrics and Gynecology | Admitting: Obstetrics and Gynecology

## 2018-03-16 HISTORY — DX: Gestational (pregnancy-induced) hypertension without significant proteinuria, unspecified trimester: O13.9

## 2018-03-16 LAB — CBC
HEMATOCRIT: 31 % — AB (ref 36.0–46.0)
Hemoglobin: 9.8 g/dL — ABNORMAL LOW (ref 12.0–15.0)
MCH: 27 pg (ref 26.0–34.0)
MCHC: 31.6 g/dL (ref 30.0–36.0)
MCV: 85.4 fL (ref 78.0–100.0)
PLATELETS: 273 10*3/uL (ref 150–400)
RBC: 3.63 MIL/uL — ABNORMAL LOW (ref 3.87–5.11)
RDW: 14.5 % (ref 11.5–15.5)
WBC: 7.8 10*3/uL (ref 4.0–10.5)

## 2018-03-16 LAB — TYPE AND SCREEN
ABO/RH(D): AB POS
Antibody Screen: NEGATIVE

## 2018-03-16 NOTE — Patient Instructions (Signed)
[name]  03/16/2018   Your procedure is scheduled on:  03/17/2018  Enter through the Main Entrance of [facility] at 1030 AM.  Pick up the phone at the desk and dial [phone]  Call this number if you have problems the morning of [phone]  Remember:   Do not eat food:(After Midnight) Desps de medianoche.  Do not drink clear liquids: (After Midnight) Desps de medianoche.  Take these medicines the morning of surgery with A SIP OF WATER: none   Do not wear jewelry, make-up or nail polish.  Do not wear lotions, powders, or perfumes. Do not wear deodorant.  Do not shave 48 hours prior to surgery.  Do not bring valuables to the hospital.  [facility] is not   responsible for any belongings or valuables brought to the hospital.  Contacts, dentures or bridgework may not be worn into surgery.  Leave suitcase in the car. After surgery it may be brought to your room.  For patients admitted to the hospital, checkout time is 11:00 AM the day of              discharge.    N/A   Please read over the following fact sheets that you were given:   Surgical Site Infection Prevention

## 2018-03-17 ENCOUNTER — Inpatient Hospital Stay (HOSPITAL_COMMUNITY): Payer: Medicaid Other | Admitting: Anesthesiology

## 2018-03-17 ENCOUNTER — Encounter (HOSPITAL_COMMUNITY)
Admission: RE | Disposition: A | Discharge status: Home / Self Care | Payer: Self-pay | Source: Home / Self Care | Attending: Obstetrics and Gynecology

## 2018-03-17 ENCOUNTER — Encounter (HOSPITAL_COMMUNITY): Payer: Self-pay | Admitting: *Deleted

## 2018-03-17 ENCOUNTER — Inpatient Hospital Stay (HOSPITAL_COMMUNITY)
Admission: RE | Admit: 2018-03-17 | Discharge: 2018-03-19 | DRG: 788 | Disposition: A | Discharge status: Home / Self Care | Payer: Medicaid Other | Attending: Obstetrics and Gynecology | Admitting: Obstetrics and Gynecology

## 2018-03-17 DIAGNOSIS — O34211 Maternal care for low transverse scar from previous cesarean delivery: Secondary | ICD-10-CM | POA: Diagnosis present

## 2018-03-17 DIAGNOSIS — Z3A39 39 weeks gestation of pregnancy: Secondary | ICD-10-CM

## 2018-03-17 DIAGNOSIS — O9902 Anemia complicating childbirth: Secondary | ICD-10-CM | POA: Diagnosis present

## 2018-03-17 DIAGNOSIS — D649 Anemia, unspecified: Secondary | ICD-10-CM | POA: Diagnosis present

## 2018-03-17 LAB — RPR: RPR Ser Ql: NONREACTIVE

## 2018-03-17 SURGERY — Surgical Case
Anesthesia: Spinal

## 2018-03-17 MED ORDER — DIBUCAINE 1 % RE OINT: 1.0000 "application " | TOPICAL_OINTMENT | RECTAL | Status: DC | PRN

## 2018-03-17 MED ORDER — MENTHOL 3 MG MT LOZG: 1.0000 | LOZENGE | OROMUCOSAL | Status: DC | PRN

## 2018-03-17 MED ORDER — KETOROLAC TROMETHAMINE 30 MG/ML IJ SOLN
INTRAMUSCULAR | Status: AC
  Filled 2018-03-17: qty 1

## 2018-03-17 MED ORDER — PHENYLEPHRINE 8 MG IN D5W 100 ML (0.08MG/ML) PREMIX OPTIME
INJECTION | INTRAVENOUS | Status: AC
  Filled 2018-03-17: qty 100

## 2018-03-17 MED ORDER — ONDANSETRON HCL 4 MG/2ML IJ SOLN
4.0000 mg | Freq: Three times a day (TID) | INTRAMUSCULAR | Status: DC | PRN
  Administered 2018-03-17: 4 mg via INTRAVENOUS
  Filled 2018-03-17: qty 2

## 2018-03-17 MED ORDER — DEXAMETHASONE SODIUM PHOSPHATE 4 MG/ML IJ SOLN
INTRAMUSCULAR | Status: DC | PRN
  Administered 2018-03-17: 4 mg via INTRAVENOUS

## 2018-03-17 MED ORDER — IBUPROFEN 600 MG PO TABS
600.0000 mg | ORAL_TABLET | Freq: Four times a day (QID) | ORAL | Status: DC
  Administered 2018-03-17 – 2018-03-19 (×5): 600 mg via ORAL
  Filled 2018-03-17 (×7): qty 1

## 2018-03-17 MED ORDER — SENNOSIDES-DOCUSATE SODIUM 8.6-50 MG PO TABS
2.0000 | ORAL_TABLET | ORAL | Status: DC
  Administered 2018-03-17: 2 via ORAL
  Filled 2018-03-17 (×3): qty 2

## 2018-03-17 MED ORDER — LACTATED RINGERS IV SOLN
INTRAVENOUS | Status: DC
  Administered 2018-03-17 (×3): via INTRAVENOUS

## 2018-03-17 MED ORDER — NALBUPHINE HCL 10 MG/ML IJ SOLN: 5.0000 mg | Freq: Once | INTRAMUSCULAR | Status: DC | PRN

## 2018-03-17 MED ORDER — OXYCODONE HCL 5 MG/5ML PO SOLN: 5.0000 mg | Freq: Once | ORAL | Status: DC | PRN

## 2018-03-17 MED ORDER — SODIUM CHLORIDE 0.9% FLUSH: 3.0000 mL | INTRAVENOUS | Status: DC | PRN

## 2018-03-17 MED ORDER — PHENYLEPHRINE 8 MG IN D5W 100 ML (0.08MG/ML) PREMIX OPTIME
INJECTION | INTRAVENOUS | Status: DC | PRN
  Administered 2018-03-17: 60 ug/min via INTRAVENOUS

## 2018-03-17 MED ORDER — FENTANYL CITRATE (PF) 100 MCG/2ML IJ SOLN
INTRAMUSCULAR | Status: DC | PRN
  Administered 2018-03-17: 10 ug via INTRATHECAL

## 2018-03-17 MED ORDER — FENTANYL CITRATE (PF) 100 MCG/2ML IJ SOLN: 25.0000 ug | INTRAMUSCULAR | Status: DC | PRN

## 2018-03-17 MED ORDER — OXYTOCIN 10 UNIT/ML IJ SOLN
INTRAMUSCULAR | Status: AC
  Filled 2018-03-17: qty 4

## 2018-03-17 MED ORDER — TETANUS-DIPHTH-ACELL PERTUSSIS 5-2.5-18.5 LF-MCG/0.5 IM SUSP: 0.5000 mL | Freq: Once | INTRAMUSCULAR | Status: DC

## 2018-03-17 MED ORDER — OXYCODONE-ACETAMINOPHEN 5-325 MG PO TABS: 2.0000 | ORAL_TABLET | ORAL | Status: DC | PRN

## 2018-03-17 MED ORDER — LACTATED RINGERS IV SOLN
INTRAVENOUS | Status: DC
  Administered 2018-03-17: 1 mL via INTRAVENOUS

## 2018-03-17 MED ORDER — KETOROLAC TROMETHAMINE 30 MG/ML IJ SOLN: 30.0000 mg | Freq: Four times a day (QID) | INTRAMUSCULAR | Status: AC | PRN

## 2018-03-17 MED ORDER — METHYLERGONOVINE MALEATE 0.2 MG PO TABS: 0.2000 mg | ORAL_TABLET | ORAL | Status: DC | PRN

## 2018-03-17 MED ORDER — ONDANSETRON HCL 4 MG/2ML IJ SOLN
INTRAMUSCULAR | Status: DC | PRN
  Administered 2018-03-17: 4 mg via INTRAVENOUS

## 2018-03-17 MED ORDER — NALOXONE HCL 0.4 MG/ML IJ SOLN: 0.4000 mg | INTRAMUSCULAR | Status: DC | PRN

## 2018-03-17 MED ORDER — SIMETHICONE 80 MG PO CHEW
80.0000 mg | CHEWABLE_TABLET | Freq: Three times a day (TID) | ORAL | Status: DC
  Administered 2018-03-18: 80 mg via ORAL
  Filled 2018-03-17 (×3): qty 1

## 2018-03-17 MED ORDER — MORPHINE SULFATE (PF) 0.5 MG/ML IJ SOLN
INTRAMUSCULAR | Status: DC | PRN
  Administered 2018-03-17: .2 mg via INTRATHECAL

## 2018-03-17 MED ORDER — ZOLPIDEM TARTRATE 5 MG PO TABS: 5.0000 mg | ORAL_TABLET | Freq: Every evening | ORAL | Status: DC | PRN

## 2018-03-17 MED ORDER — PRENATAL MULTIVITAMIN CH
1.0000 | ORAL_TABLET | Freq: Every day | ORAL | Status: DC
  Filled 2018-03-17: qty 1

## 2018-03-17 MED ORDER — OXYTOCIN 40 UNITS IN LACTATED RINGERS INFUSION - SIMPLE MED: 2.5000 [IU]/h | INTRAVENOUS | Status: AC

## 2018-03-17 MED ORDER — SCOPOLAMINE 1 MG/3DAYS TD PT72
1.0000 | MEDICATED_PATCH | Freq: Once | TRANSDERMAL | Status: DC
  Administered 2018-03-17: 1.5 mg via TRANSDERMAL
  Filled 2018-03-17: qty 1

## 2018-03-17 MED ORDER — NALBUPHINE HCL 10 MG/ML IJ SOLN: 5.0000 mg | INTRAMUSCULAR | Status: DC | PRN

## 2018-03-17 MED ORDER — MORPHINE SULFATE (PF) 0.5 MG/ML IJ SOLN
INTRAMUSCULAR | Status: AC
  Filled 2018-03-17: qty 10

## 2018-03-17 MED ORDER — OXYCODONE HCL 5 MG PO TABS: 5.0000 mg | ORAL_TABLET | Freq: Once | ORAL | Status: DC | PRN

## 2018-03-17 MED ORDER — ONDANSETRON HCL 4 MG/2ML IJ SOLN: 4.0000 mg | Freq: Four times a day (QID) | INTRAMUSCULAR | Status: DC | PRN

## 2018-03-17 MED ORDER — ONDANSETRON HCL 4 MG/2ML IJ SOLN
INTRAMUSCULAR | Status: AC
  Filled 2018-03-17: qty 2

## 2018-03-17 MED ORDER — WITCH HAZEL-GLYCERIN EX PADS: 1.0000 "application " | MEDICATED_PAD | CUTANEOUS | Status: DC | PRN

## 2018-03-17 MED ORDER — SIMETHICONE 80 MG PO CHEW: 80.0000 mg | CHEWABLE_TABLET | ORAL | Status: DC | PRN

## 2018-03-17 MED ORDER — METHYLERGONOVINE MALEATE 0.2 MG/ML IJ SOLN: 0.2000 mg | INTRAMUSCULAR | Status: DC | PRN

## 2018-03-17 MED ORDER — BUPIVACAINE IN DEXTROSE 0.75-8.25 % IT SOLN
INTRATHECAL | Status: DC | PRN
  Administered 2018-03-17: 1.8 mL via INTRATHECAL

## 2018-03-17 MED ORDER — DIPHENHYDRAMINE HCL 25 MG PO CAPS: 25.0000 mg | ORAL_CAPSULE | ORAL | Status: DC | PRN

## 2018-03-17 MED ORDER — SIMETHICONE 80 MG PO CHEW
80.0000 mg | CHEWABLE_TABLET | ORAL | Status: DC
  Administered 2018-03-17: 80 mg via ORAL
  Filled 2018-03-17 (×2): qty 1

## 2018-03-17 MED ORDER — DIPHENHYDRAMINE HCL 25 MG PO CAPS: 25.0000 mg | ORAL_CAPSULE | Freq: Four times a day (QID) | ORAL | Status: DC | PRN

## 2018-03-17 MED ORDER — OXYCODONE-ACETAMINOPHEN 5-325 MG PO TABS
1.0000 | ORAL_TABLET | ORAL | Status: DC | PRN
  Administered 2018-03-19: 1 via ORAL
  Filled 2018-03-17: qty 1

## 2018-03-17 MED ORDER — SODIUM CHLORIDE 0.9 % IR SOLN
Status: DC | PRN
  Administered 2018-03-17: 1

## 2018-03-17 MED ORDER — DIPHENHYDRAMINE HCL 50 MG/ML IJ SOLN: 12.5000 mg | INTRAMUSCULAR | Status: DC | PRN

## 2018-03-17 MED ORDER — LACTATED RINGERS IV SOLN
INTRAVENOUS | Status: DC | PRN
  Administered 2018-03-17: 13:00:00 via INTRAVENOUS

## 2018-03-17 MED ORDER — KETOROLAC TROMETHAMINE 30 MG/ML IJ SOLN
30.0000 mg | Freq: Four times a day (QID) | INTRAMUSCULAR | Status: AC | PRN
  Administered 2018-03-17: 30 mg via INTRAVENOUS

## 2018-03-17 MED ORDER — DEXAMETHASONE SODIUM PHOSPHATE 4 MG/ML IJ SOLN
INTRAMUSCULAR | Status: AC
  Filled 2018-03-17: qty 1

## 2018-03-17 MED ORDER — CEFAZOLIN SODIUM-DEXTROSE 2-4 GM/100ML-% IV SOLN
2.0000 g | INTRAVENOUS | Status: AC
  Administered 2018-03-17: 2 g via INTRAVENOUS
  Filled 2018-03-17: qty 100

## 2018-03-17 MED ORDER — FENTANYL CITRATE (PF) 100 MCG/2ML IJ SOLN
INTRAMUSCULAR | Status: AC
  Filled 2018-03-17: qty 2

## 2018-03-17 MED ORDER — NALOXONE HCL 4 MG/10ML IJ SOLN: 1.0000 ug/kg/h | INTRAVENOUS | Status: DC | PRN

## 2018-03-17 MED ORDER — COCONUT OIL OIL: 1.0000 "application " | TOPICAL_OIL | Status: DC | PRN

## 2018-03-17 MED ORDER — ACETAMINOPHEN 325 MG PO TABS
650.0000 mg | ORAL_TABLET | ORAL | Status: DC | PRN
  Administered 2018-03-17 – 2018-03-19 (×2): 650 mg via ORAL
  Filled 2018-03-17 (×2): qty 2

## 2018-03-17 MED ORDER — MEPERIDINE HCL 25 MG/ML IJ SOLN: 6.2500 mg | INTRAMUSCULAR | Status: DC | PRN

## 2018-03-17 MED ORDER — LACTATED RINGERS IV SOLN
INTRAVENOUS | Status: DC | PRN
  Administered 2018-03-17: 40 [IU] via INTRAVENOUS

## 2018-03-17 SURGICAL SUPPLY — 32 items
CHLORAPREP W/TINT 26ML (MISCELLANEOUS) ×3 IMPLANT
CLAMP CORD UMBIL (MISCELLANEOUS) IMPLANT
CLOTH BEACON ORANGE TIMEOUT ST (SAFETY) ×3 IMPLANT
DERMABOND ADVANCED (GAUZE/BANDAGES/DRESSINGS) ×2
DERMABOND ADVANCED .7 DNX12 (GAUZE/BANDAGES/DRESSINGS) ×1 IMPLANT
DRSG OPSITE POSTOP 4X10 (GAUZE/BANDAGES/DRESSINGS) ×3 IMPLANT
ELECT REM PT RETURN 9FT ADLT (ELECTROSURGICAL) ×3
ELECTRODE REM PT RTRN 9FT ADLT (ELECTROSURGICAL) ×1 IMPLANT
EXTRACTOR VACUUM M CUP 4 TUBE (SUCTIONS) IMPLANT
EXTRACTOR VACUUM M CUP 4' TUBE (SUCTIONS)
GLOVE BIO SURGEON STRL SZ7 (GLOVE) ×3 IMPLANT
GLOVE BIOGEL PI IND STRL 7.0 (GLOVE) ×1 IMPLANT
GLOVE BIOGEL PI INDICATOR 7.0 (GLOVE) ×2
GOWN STRL REUS W/TWL LRG LVL3 (GOWN DISPOSABLE) ×6 IMPLANT
HEMOSTAT ARISTA ABSORB 3G PWDR (MISCELLANEOUS) ×3 IMPLANT
KIT ABG SYR 3ML LUER SLIP (SYRINGE) IMPLANT
NEEDLE HYPO 22GX1.5 SAFETY (NEEDLE) IMPLANT
NEEDLE HYPO 25X5/8 SAFETYGLIDE (NEEDLE) IMPLANT
NS IRRIG 1000ML POUR BTL (IV SOLUTION) ×3 IMPLANT
PACK C SECTION WH (CUSTOM PROCEDURE TRAY) ×3 IMPLANT
PAD OB MATERNITY 4.3X12.25 (PERSONAL CARE ITEMS) ×3 IMPLANT
PENCIL SMOKE EVAC W/HOLSTER (ELECTROSURGICAL) ×3 IMPLANT
RTRCTR C-SECT PINK 25CM LRG (MISCELLANEOUS) ×3 IMPLANT
SUT CHROMIC 1 CTX 36 (SUTURE) ×6 IMPLANT
SUT CHROMIC 2 0 CT 1 (SUTURE) ×3 IMPLANT
SUT PDS AB 0 CTX 60 (SUTURE) ×3 IMPLANT
SUT VIC AB 2-0 CT1 27 (SUTURE) ×2
SUT VIC AB 2-0 CT1 TAPERPNT 27 (SUTURE) ×1 IMPLANT
SUT VIC AB 4-0 KS 27 (SUTURE) IMPLANT
SYR 30ML LL (SYRINGE) IMPLANT
TOWEL OR 17X24 6PK STRL BLUE (TOWEL DISPOSABLE) ×3 IMPLANT
TRAY FOLEY W/BAG SLVR 14FR LF (SET/KITS/TRAYS/PACK) ×3 IMPLANT

## 2018-03-17 NOTE — Anesthesia Preprocedure Evaluation (Signed)
Anesthesia Evaluation  Patient identified by MRN, date of birth, ID band Patient awake    Reviewed: Allergy & Precautions, H&P , NPO status , Patient's Chart, lab work & pertinent test results  Airway Mallampati: II   Neck ROM: full    Dental   Pulmonary neg pulmonary ROS,    breath sounds clear to auscultation       Cardiovascular hypertension,  Rhythm:regular Rate:Normal     Neuro/Psych    GI/Hepatic   Endo/Other    Renal/GU      Musculoskeletal   Abdominal   Peds  Hematology  (+) anemia ,   Anesthesia Other Findings   Reproductive/Obstetrics (+) Pregnancy                             Anesthesia Physical Anesthesia Plan  ASA: II  Anesthesia Plan: Spinal   Post-op Pain Management:    Induction: Intravenous  PONV Risk Score and Plan: 2 and Ondansetron, Scopolamine patch - Pre-op and Treatment may vary due to age or medical condition  Airway Management Planned: Nasal Cannula  Additional Equipment:   Intra-op Plan:   Post-operative Plan:   Informed Consent: I have reviewed the patients History and Physical, chart, labs and discussed the procedure including the risks, benefits and alternatives for the proposed anesthesia with the patient or authorized representative who has indicated his/her understanding and acceptance.     Plan Discussed with: CRNA, Anesthesiologist and Surgeon  Anesthesia Plan Comments:         Anesthesia Quick Evaluation

## 2018-03-17 NOTE — Anesthesia Procedure Notes (Signed)
Spinal  Patient location during procedure: OR Start time: 03/17/2018 12:27 PM End time: 03/17/2018 12:29 PM Staffing Anesthesiologist: Achille Rich, MD Performed: anesthesiologist  Preanesthetic Checklist Completed: patient identified, surgical consent, pre-op evaluation, timeout performed, IV checked, risks and benefits discussed and monitors and equipment checked Spinal Block Patient position: sitting Prep: DuraPrep Patient monitoring: cardiac monitor, continuous pulse ox and blood pressure Approach: midline Location: L3-4 Injection technique: single-shot Needle Needle type: Pencan  Needle gauge: 24 G Needle length: 9 cm Assessment Sensory level: T10 Additional Notes Functioning IV was confirmed and monitors were applied. Sterile prep and drape, including hand hygiene and sterile gloves were used. The patient was positioned and the spine was prepped. The skin was anesthetized with lidocaine.  Free flow of clear CSF was obtained prior to injecting local anesthetic into the CSF.  The spinal needle aspirated freely following injection.  The needle was carefully withdrawn.  The patient tolerated the procedure well.

## 2018-03-17 NOTE — H&P (Signed)
[name] is a 27 y.o. female presenting for repeat cesarean section  27 yo G3P2002 @ 39+4 presents for repeat cesarean section OB History    Gravida  3   Para  2   Term  2   Preterm      AB      Living  2     SAB      TAB      Ectopic      Multiple  0   Live Births  2          Past Medical History:  Diagnosis Date  . Pregnancy induced hypertension    Past Surgical History:  Procedure Laterality Date  . CESAREAN SECTION    . CESAREAN SECTION N/A 10/17/2014   Procedure: REPEAT CESAREAN SECTION;  Surgeon: Kathreen Cosier, MD;  Location: [facility];  Service: Obstetrics;  Laterality: N/A;   Family History: family history includes Arthritis in her mother; Hypertension in her mother. Social History:  reports that she has never smoked. She has never used smokeless tobacco. She reports that she drinks alcohol. She reports that she does not use drugs.     Maternal Diabetes: No Genetic Screening: Normal Maternal Ultrasounds/Referrals: Normal Fetal Ultrasounds or other Referrals:  None Maternal Substance Abuse:  No Significant Maternal Medications:  None Significant Maternal Lab Results:  None Other Comments:  None  ROS History   Blood pressure (!) 142/93, pulse 91, temperature 98.4 F (36.9 C), temperature source Oral, resp. rate 20, height 5' (1.524 m), weight 94.9 kg (209 lb 3.2 oz), last menstrual period 05/20/2017, unknown if currently breastfeeding. Exam Physical Exam  Prenatal labs: ABO, Rh: --/--/AB POS (05/28 1005) Antibody: NEG (05/28 1005) Rubella: Immune (11/27 0000) RPR: Non Reactive (05/28 1005)  HBsAg: Negative (11/27 0000)  HIV: Non-reactive (11/27 0000)  GBS: Negative (05/03 0000)   Assessment/Plan: 1) Admit 2) Consent for C/S 3) SCDs    [name] 03/17/2018, 11:57 AM

## 2018-03-17 NOTE — Transfer of Care (Signed)
Immediate Anesthesia Transfer of Care Note  Patient: [name]  Procedure(s) Performed: REPEAT CESAREAN SECTION (N/A )  Patient Location: PACU  Anesthesia Type:Spinal  Level of Consciousness: awake, alert  and oriented  Airway & Oxygen Therapy: Patient Spontanous Breathing  Post-op Assessment: Report given to RN and Post -op Vital signs reviewed and stable  Post vital signs: Reviewed and stable  Last Vitals:  Vitals Value Taken Time  BP    Temp    Pulse 81 03/17/2018  1:45 PM  Resp    SpO2 97 % 03/17/2018  1:45 PM  Vitals shown include unvalidated device data.  Last Pain:  Vitals:   03/17/18 0931  TempSrc: Oral         Complications: No apparent anesthesia complications

## 2018-03-17 NOTE — Progress Notes (Signed)
   03/17/18 1530 03/17/18 1638  Hourly Rounding  Assessment Alert;Patient resting Alert  Intervention Pain assessed;Comfort measures;Plan of care discussed with pt/other;Family sitting w/ patient;Introduced self to pt/other Call light w/in reach;Comfort measures;Environment secured;Pain assessed  Pain Assessment  Pain Assessment 0-10 0-10  Pain Score 7 7  Pain Location Abdomen Abdomen  Pain Orientation Right;Lower Right  Pain Descriptors / Indicators Aching Aching  Pain Frequency Constant Constant  Pain Onset On-going On-going  Pain Intervention(s) Refused Refused  POSS Scale (Pasero Opioid Sedation Scale)  POSS *See Group Information*  --  S-Acceptable,Sleep, easy to arouse  Fundal Assessment  Fundal Tone  (refused assessment)  (pt refused)  Lochia  Color Rubra Rubra  Amount Small Small  Odor None None  Clots None None

## 2018-03-17 NOTE — Op Note (Signed)
Pre-Operative Diagnosis: 1) 39+4 week intrauterine pregnancy 2) History of prior cesarean section x 2 Postoperative Diagnosis: 1) 39+4 week intrauterine pregnancy 2) History of prior cesarean section x 2 3) Significant adhesive disease between anterior abdominal wall and anterior uterus  Procedure: Repeat cesarean section, lysis of adhesions Surgeon: Dr. [name] Assistant: Webb Silversmith, RNFA Operative Findings: Vigorous female infant in the vertex presentation with apgar scores of 9 at 1 minute and 9 at 5 minutes. Dense adhesive disease between the anterior abdominal wall and the uterus. The adhesions were so dense that they could not be completely taken down. Ovaries and tubes could not be visualized due to the scar tissue Specimen: placenta for disposal EBL: Total I/O In: 2100 [I.V.:2100] Out: 620 [Urine:150; Blood:470]   Procedure:[name] is an 27 year old gravida 3 para 2002 at 79 weeks and 4 days estimated gestational age who presents for cesarean section. Following the appropriate informed consent the patient was brought to the operating room where spinal anesthesia was administered and found to be adequate. She was placed in the dorsal supine position with a leftward tilt. She was prepped and draped in the normal sterile fashion. Scalpel was then used to make a Pfannenstiel skin incision which was carried down to the underlying layers of soft tissue to the fascia. The fascia was incised in the midline and the fascial incision was extended laterally with Mayo scissors. The superior aspect of the fascial incision was grasped with Coker clamps x2, tented up and the rectus muscles dissected off sharply with sharp dissection and the electrocautery unit.  Due to adhesive disease the dissection of the rectus muscle was difficult.  The Kocher clamps were then placed on the inferior aspect of the fascial incision and the electrocautery unit was used to dissect the rectus off the fascia.   Due to midline adhesions the rectus muscle was separated lateral to the midline and the peritoneum was identified, tented up and entered sharply.  This incision was extended superiorly and inferiorly.  Additional care was taken to try to further dissect the anterior abdominal wall off of the uterus.  Enough of the adhesions were taken down to allow the deployment of the Alexis retractor, however significant dense adhesions remained.  The scalpel was then used to make a low transverse incision on the uterus which was extended laterally with blunt dissection.  Amniotomy was performed for clear fluid.  The fetal vertex was delivered with the assistance of a vacuum extractor.  The infant cried vigorously on the operative fields.  A 1 minute cord clamp delay was performed and then the infant was passed to the waiting neonatology team after the cord was clamped and cut.  The placenta was then spontaneously delivered and the uterus was cleared of all clot and debris.  The uterus was repaired in a single layer closure.  A small defect was repaired with a running locked suture in the anterior serosa and myometrium of the uterus.  Arixtra was then applied to the uterus to achieve hemostasis.  The Alexis retractor was removed and the remnants of the peritoneum were identified and reapproximated with 2-0 Vicryl in a running fashion.  The rectus muscle was repaired and reapproximated with 2-0 chromic in a running fashion.  The fascia was closed with a looped PDS.  The skin was closed with 4-0 Vicryl in a subcuticular fashion and Dermabond.  All sponge, lap, needle counts were correct.  The patient was transferred to PACU in stable condition following the procedure  the electrocautery unit area and the same procedure was repeated on the inferior aspect of the fascial incision. The rectus muscles were separated in the midline. The abdominal peritoneum was identified, tented up, entered sharply, and the incision was  extended superiorly and inferiorly with good visualization of the bladder. The Alexis retractor was then deployed. The vesicouterine peritoneum was identified, tented up, entered sharply, and the bladder flap was created digitally. Scalpel was then used to make a low transverse incision on the uterus which was extended laterally with both blunt dissection and the bandage scissors. The fetal vertex was identified, delivered easily through the uterine incision followed by the body. The infant was bulb suctioned on the operative field cried vigorously, cord was clamped and cut and the infant was passed to the waiting neonatologist. Placenta was then delivered spontaneously, the uterus was cleared of all clot and debris. The uterine incision was repaired with #1 chromic in running locked fashion followed by a second imbricating layer. Ovaries and tubes were inspected and normal. The Alexis retractor was removed. The uterus was returned to the abdominal cavity the abdominal cavity was cleared of all clot and debris. The abdominal peritoneum was reapproximated with 2-0 Vicryl in a running fashion, the rectus muscles was reapproximated with #1 chromic in a running fashion. The fascia was closed with a looped PDS in a running fashion. The skin was closed with 4-0 vicryl in a subcuticular fashion and Dermabon. All sponge lap and needle counts were correct x2. Patient tolerated the procedure well and recovered in stable condition following the procedure.

## 2018-03-18 ENCOUNTER — Other Ambulatory Visit: Payer: Self-pay

## 2018-03-18 LAB — CBC
HCT: 27.2 % — ABNORMAL LOW (ref 36.0–46.0)
Hemoglobin: 8.5 g/dL — ABNORMAL LOW (ref 12.0–15.0)
MCH: 26.7 pg (ref 26.0–34.0)
MCHC: 31.3 g/dL (ref 30.0–36.0)
MCV: 85.5 fL (ref 78.0–100.0)
PLATELETS: 241 10*3/uL (ref 150–400)
RBC: 3.18 MIL/uL — AB (ref 3.87–5.11)
RDW: 14.5 % (ref 11.5–15.5)
WBC: 10.2 10*3/uL (ref 4.0–10.5)

## 2018-03-18 NOTE — Anesthesia Postprocedure Evaluation (Signed)
Anesthesia Post Note  Patient: Danique L Adams  Procedure(s) Performed: REPEAT CESAREAN SECTION (N/A )     Patient location during evaluation: PACU Anesthesia Type: Spinal Level of consciousness: oriented and awake and alert Pain management: pain level controlled Vital Signs Assessment: post-procedure vital signs reviewed and stable Respiratory status: spontaneous breathing, respiratory function stable and patient connected to nasal cannula oxygen Cardiovascular status: blood pressure returned to baseline and stable Postop Assessment: no headache, no backache and no apparent nausea or vomiting Anesthetic complications: no    Last Vitals:  Vitals:   03/18/18 0400 03/18/18 0808  BP: 125/65 123/80  Pulse: 65 (!) 57  Resp:    Temp: 36.7 C 36.5 C  SpO2: 100% 100%    Last Pain:  Vitals:   03/18/18 0808  TempSrc: Oral  PainSc:                  Arlenne Kimbley S

## 2018-03-18 NOTE — Progress Notes (Signed)
Patient is doing well.  She is tolerating PO, ambulating.  Foley catheter was just removed this AM and she has yet to void.  Pain is controlled.  Lochia is appropriate  Vitals:   03/17/18 2359 03/18/18 0337 03/18/18 0400 03/18/18 0808  BP: 116/79 125/65 125/65 123/80  Pulse: 75 65 65 (!) 57  Resp:      Temp:  98.1 F (36.7 C) 98.1 F (36.7 C) 97.7 F (36.5 C)  TempSrc:  Oral Oral Oral  SpO2:  100% 100% 100%  Weight:      Height:        NAD Abdomen:  soft, appropriate tenderness, incisions intact and without erythema or drainage ext:    Symmetric, 1+ edema bilaterally  Lab Results  Component Value Date   WBC 10.2 03/18/2018   HGB 8.5 (L) 03/18/2018   HCT 27.2 (L) 03/18/2018   MCV 85.5 03/18/2018   PLT 241 03/18/2018    --/--/AB POS (05/28 1005)/RImmune  A/P    27 y.o. G3P3003 POD 1 s/p RCS Routine post op and postpartum care.   Foley catheter removed this AM

## 2018-03-19 MED ORDER — OXYCODONE-ACETAMINOPHEN 5-325 MG PO TABS: 1.0000 | ORAL_TABLET | ORAL | 0 refills | Status: DC | PRN

## 2018-03-19 MED ORDER — IBUPROFEN 600 MG PO TABS: 600.0000 mg | ORAL_TABLET | Freq: Four times a day (QID) | ORAL | 0 refills | Status: DC

## 2018-03-19 NOTE — Progress Notes (Signed)
  Patient is eating, ambulating, voiding.  Pain control is good.  Vitals:   03/18/18 0400 03/18/18 0808 03/18/18 1130 03/18/18 1902  BP: 125/65 123/80 132/83 129/89  Pulse: 65 (!) 57 84 88  Resp:      Temp: 98.1 F (36.7 C) 97.7 F (36.5 C) 98.2 F (36.8 C) 98.5 F (36.9 C)  TempSrc: Oral Oral Oral Oral  SpO2: 100% 100% 94%   Weight:      Height:        lungs:   clear to auscultation cor:    RRR Abdomen:  soft, appropriate tenderness, incisions intact and without erythema or exudate ex:    no cords   Lab Results  Component Value Date   WBC 10.2 03/18/2018   HGB 8.5 (L) 03/18/2018   HCT 27.2 (L) 03/18/2018   MCV 85.5 03/18/2018   PLT 241 03/18/2018    --/--/AB POS (05/28 1005)/RI  A/P    Post operative day 2.  Routine post op and postpartum care.  Expect d/c today.  Percocet for pain control. Iron for anemia.

## 2018-03-19 NOTE — Discharge Summary (Signed)
Obstetric Discharge Summary Reason for Admission: cesarean section Prenatal Procedures: none Intrapartum Procedures: cesarean: low cervical, transverse Postpartum Procedures: none Complications-Operative and Postpartum: none Hemoglobin  Date Value Ref Range Status  03/18/2018 8.5 (L) 12.0 - 15.0 g/dL Final   HCT  Date Value Ref Range Status  03/18/2018 27.2 (L) 36.0 - 46.0 % Final    Discharge Diagnoses: Term Pregnancy-delivered  Discharge Information: Date: 03/19/2018 Activity: pelvic rest Diet: routine Medications: Ibuprofen, Iron and Percocet Condition: stable Instructions: refer to practice specific booklet Discharge to: home Follow-up Information    [name], MD Follow up in 4 week(s).   Specialty:  Obstetrics and Gynecology Contact information: [address] [place] [address] [phone]           Newborn Data: Live born female  Birth Weight: 8 lb 2.3 oz (3695 g) APGAR: 9, 9  Newborn Delivery   Birth date/time:  03/17/2018 12:56:00 Delivery type:  C-Section, Vacuum Assisted Trial of labor:  No C-section categorization:  Repeat     Home with mother.  [name] 03/19/2018, 7:50 AM

## 2018-08-01 ENCOUNTER — Encounter (HOSPITAL_COMMUNITY): Payer: Self-pay | Admitting: Emergency Medicine

## 2018-08-01 ENCOUNTER — Other Ambulatory Visit: Payer: Self-pay

## 2018-08-01 ENCOUNTER — Emergency Department (HOSPITAL_COMMUNITY): Payer: Medicaid Other

## 2018-08-01 ENCOUNTER — Emergency Department (HOSPITAL_COMMUNITY)
Admission: EM | Admit: 2018-08-01 | Discharge: 2018-08-01 | Disposition: A | Discharge status: Home / Self Care | Payer: Medicaid Other | Attending: Emergency Medicine | Admitting: Emergency Medicine

## 2018-08-01 DIAGNOSIS — M62838 Other muscle spasm: Secondary | ICD-10-CM | POA: Diagnosis not present

## 2018-08-01 DIAGNOSIS — Z79899 Other long term (current) drug therapy: Secondary | ICD-10-CM | POA: Diagnosis not present

## 2018-08-01 DIAGNOSIS — M542 Cervicalgia: Secondary | ICD-10-CM | POA: Diagnosis present

## 2018-08-01 DIAGNOSIS — Z87828 Personal history of other (healed) physical injury and trauma: Secondary | ICD-10-CM

## 2018-08-01 MED ORDER — METHOCARBAMOL 500 MG PO TABS: 500.0000 mg | ORAL_TABLET | Freq: Two times a day (BID) | ORAL | 0 refills | Status: DC

## 2018-08-01 MED ORDER — NAPROXEN 375 MG PO TABS: 375.0000 mg | ORAL_TABLET | Freq: Two times a day (BID) | ORAL | 0 refills | Status: DC

## 2018-08-01 MED ORDER — KETOROLAC TROMETHAMINE 15 MG/ML IJ SOLN
30.0000 mg | Freq: Once | INTRAMUSCULAR | Status: AC
  Administered 2018-08-01: 30 mg via INTRAMUSCULAR
  Filled 2018-08-01: qty 2

## 2018-08-01 MED ORDER — PREDNISONE 5 MG PO TABS: 20.0000 mg | ORAL_TABLET | Freq: Every day | ORAL | 0 refills | Status: DC

## 2018-08-01 MED ORDER — METHOCARBAMOL 500 MG PO TABS
500.0000 mg | ORAL_TABLET | Freq: Once | ORAL | Status: AC
  Administered 2018-08-01: 500 mg via ORAL
  Filled 2018-08-01: qty 1

## 2018-08-01 NOTE — ED Notes (Signed)
ED Provider at bedside. 

## 2018-08-01 NOTE — ED Triage Notes (Signed)
EMS stated, pt. Playing with daughter and hanging on her.

## 2018-08-01 NOTE — ED Notes (Signed)
Patient transported to X-ray 

## 2018-08-01 NOTE — Discharge Instructions (Signed)
Follow up with your doctor for recheck. Take the medication as directed. Do not drive while taking the muscle relaxer as it will make you sleepy.
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
## 2018-08-01 NOTE — ED Notes (Addendum)
Patient reports her little girl was swinging on her neck last week and has had neck pain since. Tried muscle relaxer and tylenol with some relief but reports pain comes pain. States pain is on R side and radiates to R temple. Denies numbness or tingling in arms but is unable to turn her head from side to side due to pain

## 2019-09-13 ENCOUNTER — Other Ambulatory Visit: Payer: Self-pay

## 2019-09-13 DIAGNOSIS — Z20822 Contact with and (suspected) exposure to covid-19: Secondary | ICD-10-CM

## 2019-09-15 LAB — NOVEL CORONAVIRUS, NAA: SARS-CoV-2, NAA: NOT DETECTED

## 2020-03-29 ENCOUNTER — Other Ambulatory Visit: Payer: Self-pay

## 2020-03-29 ENCOUNTER — Ambulatory Visit (INDEPENDENT_AMBULATORY_CARE_PROVIDER_SITE_OTHER): Payer: Medicaid Other | Admitting: Orthopedic Surgery

## 2020-03-29 ENCOUNTER — Ambulatory Visit: Payer: Self-pay

## 2020-03-29 ENCOUNTER — Encounter: Payer: Self-pay | Admitting: Physician Assistant

## 2020-03-29 VITALS — Ht 60.0 in | Wt 209.2 lb

## 2020-03-29 DIAGNOSIS — S92255A Nondisplaced fracture of navicular [scaphoid] of left foot, initial encounter for closed fracture: Secondary | ICD-10-CM | POA: Diagnosis not present

## 2020-03-29 DIAGNOSIS — M25572 Pain in left ankle and joints of left foot: Secondary | ICD-10-CM

## 2020-03-29 DIAGNOSIS — Z6841 Body Mass Index (BMI) 40.0 and over, adult: Secondary | ICD-10-CM

## 2020-03-29 DIAGNOSIS — M79672 Pain in left foot: Secondary | ICD-10-CM | POA: Diagnosis not present

## 2020-03-29 NOTE — Progress Notes (Signed)
Office Visit Note   Patient: [name]           Date of Birth: 18-Oct-1991           MRN: [id number] Visit Date: 03/29/2020              Requested by: Department, [facility] [place],  [place] [address] PCP: Department, [facility]  Chief Complaint  Patient presents with   Left Ankle - Pain, New Patient (Initial Visit)      HPI: Patient is a 29 year old woman who presents with acute pain dorsum of the left ankle and left foot.  She states that this started on May 29 when she was jumping on a trampoline and feels like she landed wrong.  She has been weightbearing with an ankle stabilizing orthosis complains of pain and swelling.  Assessment & Plan: Visit Diagnoses:  1. Left foot pain   2. Closed nondisplaced fracture of navicular bone of left foot, initial encounter     Plan: We will place her in a fracture boot weightbearing as tolerated minimize her activities.  Three-view radiographs of the left foot at follow-up.  Anticipate 6 to 8 weeks of immobilization.  Follow-Up Instructions: Return in about 4 weeks (around 04/26/2020).   Ortho Exam  Patient is alert, oriented, no adenopathy, well-dressed, normal affect, normal respiratory effort. Examination patient has a good dorsalis pedis pulse she is point tender to palpation over the talonavicular joint.  There is no redness no cellulitis no signs of infection.  She has a BMI of 40.  Past medical history negative for tobacco negative for diabetes.  Imaging: XR Ankle 2 Views Left  Result Date: 03/29/2020 2 view radiographs of the left ankle shows an evulsion chip off the navicular at the talonavicular joint.  XR Foot 2 Views Left  Result Date: 03/29/2020 2 view radiographs of the left foot shows an evulsion off the dorsal medial aspect of the navicular at the talonavicular joint.  Avulsion fracture is nondisplaced.  No images are attached to the encounter.  Labs: No results  found for: HGBA1C, ESRSEDRATE, CRP, LABURIC, REPTSTATUS, GRAMSTAIN, CULT, LABORGA   No results found for: ALBUMIN, PREALBUMIN, LABURIC  No results found for: MG No results found for: VD25OH  No results found for: PREALBUMIN CBC EXTENDED Latest Ref Rng & Units 03/18/2018 03/16/2018 10/18/2014  WBC 4.0 - 10.5 K/uL 10.2 7.8 8.8  RBC 3.87 - 5.11 MIL/uL 3.18(L) 3.63(L) 3.39(L)  HGB 12.0 - 15.0 g/dL 8.5(L) 9.8(L) 10.2(L)  HCT 36 - 46 % 27.2(L) 31.0(L) 30.3(L)  PLT 150 - 400 K/uL 241 273 232     Body mass index is 40.86 kg/m.  Orders:  Orders Placed This Encounter  Procedures   XR Ankle 2 Views Left   XR Foot 2 Views Left   No orders of the defined types were placed in this encounter.    Procedures: No procedures performed  Clinical Data: No additional findings.  ROS:  All other systems negative, except as noted in the HPI. Review of Systems  Objective: Vital Signs: Ht 5' (1.524 m)    Wt 209 lb 3.2 oz (94.9 kg)    BMI 40.86 kg/m   Specialty Comments:  No specialty comments available.  PMFS History: Patient Active Problem List   Diagnosis Date Noted   Cesarean delivery, delivered, current hospitalization 03/17/2018   S/P cesarean section 10/17/2014   Past Medical History:  Diagnosis Date   Pregnancy induced hypertension  Family History  Problem Relation Age of Onset   Arthritis Mother    Hypertension Mother     Past Surgical History:  Procedure Laterality Date   CESAREAN SECTION     CESAREAN SECTION N/A 10/17/2014   Procedure: REPEAT CESAREAN SECTION;  Surgeon: Kathreen Cosier, MD;  Location: [facility];  Service: Obstetrics;  Laterality: N/A;   CESAREAN SECTION N/A 03/17/2018   Procedure: REPEAT CESAREAN SECTION;  Surgeon: [name], MD;  Location: [facility] BIRTHING SUITES;  Service: Obstetrics;  Laterality: N/A;  Heather RNFA   Social History   Occupational History   Not on file  Tobacco Use   Smoking status: Never Smoker   Smokeless  tobacco: Never Used  Substance and Sexual Activity   Alcohol use: Yes   Drug use: No   Sexual activity: Yes    Birth control/protection: Injection

## 2020-04-26 ENCOUNTER — Ambulatory Visit: Payer: Self-pay

## 2020-04-26 ENCOUNTER — Encounter: Payer: Self-pay | Admitting: Physician Assistant

## 2020-04-26 ENCOUNTER — Ambulatory Visit (INDEPENDENT_AMBULATORY_CARE_PROVIDER_SITE_OTHER): Payer: Medicaid Other | Admitting: Physician Assistant

## 2020-04-26 ENCOUNTER — Other Ambulatory Visit: Payer: Self-pay

## 2020-04-26 VITALS — Ht 60.0 in | Wt 209.2 lb

## 2020-04-26 DIAGNOSIS — M79672 Pain in left foot: Secondary | ICD-10-CM

## 2020-04-26 NOTE — Progress Notes (Signed)
Office Visit Note   Patient: [name]           Date of Birth: 1991/02/08           MRN: [id number] Visit Date: 04/26/2020              Requested by: Department, [facility] [address] [place],  [place] [address] PCP: Department, [facility]  Chief Complaint  Patient presents with  . Left Foot - Pain, Follow-up      HPI:  Patient is here in follow-up for her left foot.  She had a nondisplaced navicular fracture which she sustained originally May 29 from a trampoline.  She has been wearing a cam walker boot weightbearing.  She states her pain level has gone from an 8 to a 5 out of 10.  Still painful when she tries to wean outside the boot Assessment & Plan: Visit Diagnoses:  1. Left foot pain     Plan: She will continue to use the boot for 3 more weeks follow-up at that time.  X-rays of her foot should be obtained  Follow-Up Instructions: No follow-ups on file.   Ortho Exam  Patient is alert, oriented, no adenopathy, well-dressed, normal affect, normal respiratory effort. Focused examination of her left foot no swelling no cellulitis pulses are intact.  She does have some tenderness over the navicular bone.  Imaging: No results found. No images are attached to the encounter.  Labs: No results found for: HGBA1C, ESRSEDRATE, CRP, LABURIC, REPTSTATUS, GRAMSTAIN, CULT, LABORGA   No results found for: ALBUMIN, PREALBUMIN, LABURIC  No results found for: MG No results found for: VD25OH  No results found for: PREALBUMIN CBC EXTENDED Latest Ref Rng & Units 03/18/2018 03/16/2018 10/18/2014  WBC 4.0 - 10.5 K/uL 10.2 7.8 8.8  RBC 3.87 - 5.11 MIL/uL 3.18(L) 3.63(L) 3.39(L)  HGB 12.0 - 15.0 g/dL 7.9(T) 9.0(Z) 10.2(L)  HCT 36 - 46 % 27.2(L) 31.0(L) 30.3(L)  PLT 150 - 400 K/uL 241 273 232     Body mass index is 40.86 kg/m.  Orders:  Orders Placed This Encounter  Procedures  . XR Foot Complete Left   No orders of the defined types  were placed in this encounter.    Procedures: No procedures performed  Clinical Data: No additional findings.  ROS:  All other systems negative, except as noted in the HPI. Review of Systems  Objective: Vital Signs: Ht 5' (1.524 m)   Wt 209 lb 3.2 oz (94.9 kg)   BMI 40.86 kg/m   Specialty Comments:  No specialty comments available.  PMFS History: Patient Active Problem List   Diagnosis Date Noted  . Cesarean delivery, delivered, current hospitalization 03/17/2018  . S/P cesarean section 10/17/2014   Past Medical History:  Diagnosis Date  . Pregnancy induced hypertension     Family History  Problem Relation Age of Onset  . Arthritis Mother   . Hypertension Mother     Past Surgical History:  Procedure Laterality Date  . CESAREAN SECTION    . CESAREAN SECTION N/A 10/17/2014   Procedure: REPEAT CESAREAN SECTION;  Surgeon: Kathreen Cosier, MD;  Location: [facility];  Service: Obstetrics;  Laterality: N/A;  . CESAREAN SECTION N/A 03/17/2018   Procedure: REPEAT CESAREAN SECTION;  Surgeon: [name], MD;  Location: [facility] BIRTHING SUITES;  Service: Obstetrics;  Laterality: N/A;  Heather RNFA   Social History   Occupational History  . Not on file  Tobacco Use  .  Smoking status: Never Smoker  . Smokeless tobacco: Never Used  Substance and Sexual Activity  . Alcohol use: Yes  . Drug use: No  . Sexual activity: Yes    Birth control/protection: Injection

## 2020-05-17 ENCOUNTER — Ambulatory Visit (INDEPENDENT_AMBULATORY_CARE_PROVIDER_SITE_OTHER): Payer: Medicaid Other | Admitting: Physician Assistant

## 2020-05-17 ENCOUNTER — Ambulatory Visit (INDEPENDENT_AMBULATORY_CARE_PROVIDER_SITE_OTHER): Payer: Medicaid Other

## 2020-05-17 ENCOUNTER — Encounter: Payer: Self-pay | Admitting: Physician Assistant

## 2020-05-17 VITALS — Ht 60.0 in | Wt 209.2 lb

## 2020-05-17 DIAGNOSIS — S92255A Nondisplaced fracture of navicular [scaphoid] of left foot, initial encounter for closed fracture: Secondary | ICD-10-CM | POA: Diagnosis not present

## 2020-05-17 NOTE — Progress Notes (Signed)
Office Visit Note   Patient: [name]           Date of Birth: 04/12/1991           MRN: [id number] Visit Date: 05/17/2020              Requested by: Department, [facility] [address] [place],  [place] [address] PCP: Department, [facility]  Chief Complaint  Patient presents with  . Left Foot - Follow-up      HPI: Patient presents today for follow-up on her left foot fracture.  She has not been wearing a boot and wears slides and a brace.  She would like to go back to work next week she states she really does not have any pain Assessment & Plan: Visit Diagnoses:  1. Closed nondisplaced fracture of navicular bone of left foot, initial encounter     Plan: Patient is released to work as of Monday May follow-up with [place] as needed.  Follow-Up Instructions: No follow-ups on file.   Ortho Exam  Patient is alert, oriented, no adenopathy, well-dressed, normal affect, normal respiratory effort. Focused examination of her foot nontender to palpation minimal soft tissue swelling good ankle range of motion no cellulitis  Imaging: No results found. No images are attached to the encounter.  Labs: No results found for: HGBA1C, ESRSEDRATE, CRP, LABURIC, REPTSTATUS, GRAMSTAIN, CULT, LABORGA   No results found for: ALBUMIN, PREALBUMIN, LABURIC  No results found for: MG No results found for: VD25OH  No results found for: PREALBUMIN CBC EXTENDED Latest Ref Rng & Units 03/18/2018 03/16/2018 10/18/2014  WBC 4.0 - 10.5 K/uL 10.2 7.8 8.8  RBC 3.87 - 5.11 MIL/uL 3.18(L) 3.63(L) 3.39(L)  HGB 12.0 - 15.0 g/dL 4.1(P) 3.7(T) 10.2(L)  HCT 36 - 46 % 27.2(L) 31.0(L) 30.3(L)  PLT 150 - 400 K/uL 241 273 232     Body mass index is 40.86 kg/m.  Orders:  Orders Placed This Encounter  Procedures  . XR Foot Complete Left   No orders of the defined types were placed in this encounter.    Procedures: No procedures performed  Clinical Data: No  additional findings.  ROS:  All other systems negative, except as noted in the HPI. Review of Systems  Objective: Vital Signs: Ht 5' (1.524 m)   Wt (!) 209 lb 3.2 oz (94.9 kg)   BMI 40.86 kg/m   Specialty Comments:  No specialty comments available.  PMFS History: Patient Active Problem List   Diagnosis Date Noted  . Cesarean delivery, delivered, current hospitalization 03/17/2018  . S/P cesarean section 10/17/2014   Past Medical History:  Diagnosis Date  . Pregnancy induced hypertension     Family History  Problem Relation Age of Onset  . Arthritis Mother   . Hypertension Mother     Past Surgical History:  Procedure Laterality Date  . CESAREAN SECTION    . CESAREAN SECTION N/A 10/17/2014   Procedure: REPEAT CESAREAN SECTION;  Surgeon: Kathreen Cosier, MD;  Location: [facility];  Service: Obstetrics;  Laterality: N/A;  . CESAREAN SECTION N/A 03/17/2018   Procedure: REPEAT CESAREAN SECTION;  Surgeon: [name], MD;  Location: [facility] BIRTHING SUITES;  Service: Obstetrics;  Laterality: N/A;  Heather RNFA   Social History   Occupational History  . Not on file  Tobacco Use  . Smoking status: Never Smoker  . Smokeless tobacco: Never Used  Substance and Sexual Activity  . Alcohol use: Yes  . Drug use: No  .  Sexual activity: Yes    Birth control/protection: Injection

## 2020-06-20 ENCOUNTER — Ambulatory Visit (HOSPITAL_COMMUNITY)
Admission: EM | Admit: 2020-06-20 | Discharge: 2020-06-20 | Disposition: A | Discharge status: Home / Self Care | Payer: Medicaid Other | Attending: Family Medicine | Admitting: Family Medicine

## 2020-06-20 ENCOUNTER — Other Ambulatory Visit: Payer: Self-pay

## 2020-06-20 DIAGNOSIS — R1010 Upper abdominal pain, unspecified: Secondary | ICD-10-CM | POA: Insufficient documentation

## 2020-06-20 DIAGNOSIS — R112 Nausea with vomiting, unspecified: Secondary | ICD-10-CM | POA: Insufficient documentation

## 2020-06-20 LAB — CBC WITH DIFFERENTIAL/PLATELET
Abs Immature Granulocytes: 0.01 10*3/uL (ref 0.00–0.07)
Basophils Absolute: 0 10*3/uL (ref 0.0–0.1)
Basophils Relative: 1 %
Eosinophils Absolute: 0.2 10*3/uL (ref 0.0–0.5)
Eosinophils Relative: 3 %
HCT: 33.7 % — ABNORMAL LOW (ref 36.0–46.0)
Hemoglobin: 10.3 g/dL — ABNORMAL LOW (ref 12.0–15.0)
Immature Granulocytes: 0 %
Lymphocytes Relative: 43 %
Lymphs Abs: 2.4 10*3/uL (ref 0.7–4.0)
MCH: 25.1 pg — ABNORMAL LOW (ref 26.0–34.0)
MCHC: 30.6 g/dL (ref 30.0–36.0)
MCV: 82 fL (ref 80.0–100.0)
Monocytes Absolute: 0.4 10*3/uL (ref 0.1–1.0)
Monocytes Relative: 7 %
Neutro Abs: 2.7 10*3/uL (ref 1.7–7.7)
Neutrophils Relative %: 46 %
Platelets: 407 10*3/uL — ABNORMAL HIGH (ref 150–400)
RBC: 4.11 MIL/uL (ref 3.87–5.11)
RDW: 17.7 % — ABNORMAL HIGH (ref 11.5–15.5)
WBC: 5.7 10*3/uL (ref 4.0–10.5)
nRBC: 0 % (ref 0.0–0.2)

## 2020-06-20 LAB — COMPREHENSIVE METABOLIC PANEL
ALT: 13 U/L (ref 0–44)
AST: 23 U/L (ref 15–41)
Albumin: 3.6 g/dL (ref 3.5–5.0)
Alkaline Phosphatase: 70 U/L (ref 38–126)
Anion gap: 8 (ref 5–15)
BUN: 8 mg/dL (ref 6–20)
CO2: 27 mmol/L (ref 22–32)
Calcium: 8.7 mg/dL — ABNORMAL LOW (ref 8.9–10.3)
Chloride: 103 mmol/L (ref 98–111)
Creatinine, Ser: 0.84 mg/dL (ref 0.44–1.00)
GFR calc Af Amer: >60 mL/min (ref >60)
GFR calc non Af Amer: >60 mL/min (ref >60)
Glucose, Bld: 117 mg/dL — ABNORMAL HIGH (ref 70–99)
Potassium: 4.1 mmol/L (ref 3.5–5.1)
Sodium: 138 mmol/L (ref 135–145)
Total Bilirubin: 0.5 mg/dL (ref 0.3–1.2)
Total Protein: 7.6 g/dL (ref 6.5–8.1)

## 2020-06-20 LAB — LIPASE, BLOOD: Lipase: 27 U/L (ref 11–51)

## 2020-06-20 MED ORDER — ACETAMINOPHEN 325 MG PO TABS
975.0000 mg | ORAL_TABLET | Freq: Once | ORAL | Status: AC
  Administered 2020-06-20: 975 mg via ORAL

## 2020-06-20 MED ORDER — OMEPRAZOLE 20 MG PO CPDR: 20.0000 mg | DELAYED_RELEASE_CAPSULE | Freq: Every day | ORAL | 0 refills | Status: DC

## 2020-06-20 MED ORDER — ONDANSETRON HCL 4 MG PO TABS: 4.0000 mg | ORAL_TABLET | Freq: Three times a day (TID) | ORAL | 0 refills | Status: DC | PRN

## 2020-06-20 MED ORDER — ACETAMINOPHEN 325 MG PO TABS
ORAL_TABLET | ORAL | Status: AC
  Filled 2020-06-20: qty 3

## 2020-06-20 MED ORDER — ONDANSETRON 4 MG PO TBDP
ORAL_TABLET | ORAL | Status: AC
  Filled 2020-06-20: qty 1

## 2020-06-20 MED ORDER — ONDANSETRON 4 MG PO TBDP
4.0000 mg | ORAL_TABLET | Freq: Once | ORAL | Status: AC
  Administered 2020-06-20: 4 mg via ORAL

## 2020-06-20 NOTE — ED Triage Notes (Signed)
Patient reports waking up this am with migraine and had episode of vomiting x 2. States she vomited again about a hour ago.   Reports intermittent cough since yesterday.   Patient report migraine is gone but abdomen is uncomfortable.   No COVID exposure.

## 2020-06-20 NOTE — Discharge Instructions (Addendum)
I am running some labs to evaluate your gallbladder. I will call you if any of these have concerning results.  Small frequent sips of fluids- Pedialyte, Gatorade, water, broth- to maintain hydration.   Zofran every 8 hours as needed for nausea or vomiting.   Advance to bland, low fat diet as tolerated.  Tylenol as needed for pain.  If your symptoms persist or worsen you may need further imaging so please follow up with your primary care provider or go to the ER if severe.

## 2020-06-20 NOTE — ED Provider Notes (Signed)
[facility]    CSN: [id number] Arrival date & time: 06/20/20  1606      History   Chief Complaint Chief Complaint  Patient presents with  . Migraine  . Emesis  . Abdominal Pain    HPI Shonita L Jemison is a 29 y.o. female.   Makenze L Fay presents with complaints of migraine which she woke with this morning. She went back to sleep, when she woke again her headache had improved but then developed abdominal pain followed by vomiting. Vomited last around approximately 1p this afternoon. No blood or black to emesis. No further headache. Stomach pain now. Also with cough. No nasal drainage or sore throat. No fever. Has had a history of headaches/ migraines in the past. Around 0330 this morning had meat balls. No known ill contacts. No chest pain. No diarrhea or constipation. No medications for symptoms. Declines covid testing. No history of covid-19 and has not received vaccination.    ROS per HPI, negative if not otherwise mentioned.      Past Medical History:  Diagnosis Date  . Pregnancy induced hypertension     Patient Active Problem List   Diagnosis Date Noted  . Cesarean delivery, delivered, current hospitalization 03/17/2018  . S/P cesarean section 10/17/2014    Past Surgical History:  Procedure Laterality Date  . CESAREAN SECTION    . CESAREAN SECTION N/A 10/17/2014   Procedure: REPEAT CESAREAN SECTION;  Surgeon: Kathreen Cosier, MD;  Location: [facility];  Service: Obstetrics;  Laterality: N/A;  . CESAREAN SECTION N/A 03/17/2018   Procedure: REPEAT CESAREAN SECTION;  Surgeon: [name], MD;  Location: [facility] BIRTHING SUITES;  Service: Obstetrics;  Laterality: N/A;  Heather RNFA    OB History    Gravida  3   Para  3   Term  3   Preterm      AB      Living  3     SAB      TAB      Ectopic      Multiple  0   Live Births  3            Home Medications    Prior to Admission medications   Medication Sig Start Date End Date  Taking? Authorizing Provider  ibuprofen (ADVIL,MOTRIN) 600 MG tablet Take 1 tablet (600 mg total) by mouth every 6 (six) hours. 03/19/18   Carrington Clamp, MD  IRON PO Take 1 tablet by mouth 2 (two) times daily.    [provider]  methocarbamol (ROBAXIN) 500 MG tablet Take 1 tablet (500 mg total) by mouth 2 (two) times daily. 08/01/18   Janne Napoleon, NP  naproxen (NAPROSYN) 375 MG tablet Take 1 tablet (375 mg total) by mouth 2 (two) times daily. 08/01/18   Janne Napoleon, NP  omeprazole (PRILOSEC) 20 MG capsule Take 1 capsule (20 mg total) by mouth daily. 06/20/20   Georgetta Haber, NP  ondansetron (ZOFRAN) 4 MG tablet Take 1 tablet (4 mg total) by mouth every 8 (eight) hours as needed for nausea or vomiting. 06/20/20   Georgetta Haber, NP  oxyCODONE-acetaminophen (PERCOCET/ROXICET) 5-325 MG tablet Take 1 tablet by mouth every 4 (four) hours as needed (pain scale 4-7). 03/19/18   Carrington Clamp, MD  predniSONE (DELTASONE) 5 MG tablet Take 4 tablets (20 mg total) by mouth daily with breakfast. 08/01/18   Janne Napoleon, NP  Prenatal Vit-Fe Fumarate-FA (PRENATAL COMPLETE) 14-0.4 MG TABS Take 1  tablet by mouth 2 (two) times daily. 08/13/17   Marlon Pel, PA-C    Family History Family History  Problem Relation Age of Onset  . Arthritis Mother   . Hypertension Mother     Social History Social History   Tobacco Use  . Smoking status: Never Smoker  . Smokeless tobacco: Never Used  Substance Use Topics  . Alcohol use: Yes  . Drug use: No     Allergies   Patient has no known allergies.   Review of Systems Review of Systems   Physical Exam Triage Vital Signs ED Triage Vitals  Enc Vitals Group     BP 06/20/20 1759 (!) 145/106     Pulse Rate 06/20/20 1759 73     Resp 06/20/20 1759 16     Temp 06/20/20 1759 98.2 F (36.8 C)     Temp Source 06/20/20 1759 Oral     SpO2 06/20/20 1759 100 %     Weight --      Height --      Head Circumference --      Peak Flow --       Pain Score 06/20/20 1758 7     Pain Loc --      Pain Edu? --      Excl. in GC? --    No data found.  Updated Vital Signs BP (!) 145/106 (BP Location: Right Arm)   Pulse 73   Temp 98.2 F (36.8 C) (Oral)   Resp 16   LMP 06/11/2020   SpO2 100%   Visual Acuity Right Eye Distance:   Left Eye Distance:   Bilateral Distance:    Right Eye Near:   Left Eye Near:    Bilateral Near:     Physical Exam Constitutional:      General: She is not in acute distress.    Appearance: She is well-developed.  Cardiovascular:     Rate and Rhythm: Normal rate.  Pulmonary:     Effort: Pulmonary effort is normal.  Abdominal:     Tenderness: There is abdominal tenderness in the right upper quadrant, epigastric area and left upper quadrant.  Skin:    General: Skin is warm and dry.  Neurological:     Mental Status: She is alert and oriented to person, place, and time.      UC Treatments / Results  Labs (all labs ordered are listed, but only abnormal results are displayed) Labs Reviewed  CBC WITH DIFFERENTIAL/PLATELET  COMPREHENSIVE METABOLIC PANEL  LIPASE, BLOOD    EKG   Radiology No results found.  Procedures Procedures (including critical care time)  Medications Ordered in UC Medications  acetaminophen (TYLENOL) tablet 975 mg (975 mg Oral Given 06/20/20 1828)  ondansetron (ZOFRAN-ODT) disintegrating tablet 4 mg (4 mg Oral Given 06/20/20 1828)    Initial Impression / Assessment and Plan / UC Course  I have reviewed the triage vital signs and the nursing notes.  Pertinent labs & imaging results that were available during my care of the patient were reviewed by me and considered in my medical decision making (see chart for details).     Upper abdominal pain which started this morning, with vomiting. Had eaten meatballs prior to onset. Cholecystitis vs cholelithiasis vs pancreatitis discussed and considered with labs collected and pending. Declines covid testing today. No  further headache. Follow up and return precautions discussed. Patient verbalized understanding and agreeable to plan.   Final Clinical Impressions(s) / UC Diagnoses   Final diagnoses:  Upper abdominal pain  Non-intractable vomiting with nausea, unspecified vomiting type     Discharge Instructions     I am running some labs to evaluate your gallbladder. I will call you if any of these have concerning results.  Small frequent sips of fluids- Pedialyte, Gatorade, water, broth- to maintain hydration.   Zofran every 8 hours as needed for nausea or vomiting.   Advance to bland, low fat diet as tolerated.  Tylenol as needed for pain.  If your symptoms persist or worsen you may need further imaging so please follow up with your primary care provider or go to the ER if severe.     ED Prescriptions    Medication Sig Dispense Auth. Provider   ondansetron (ZOFRAN) 4 MG tablet Take 1 tablet (4 mg total) by mouth every 8 (eight) hours as needed for nausea or vomiting. 10 tablet Linus Mako B, NP   omeprazole (PRILOSEC) 20 MG capsule Take 1 capsule (20 mg total) by mouth daily. 30 capsule Georgetta Haber, NP     PDMP not reviewed this encounter.   Georgetta Haber, NP 06/20/20 1849

## 2021-02-26 ENCOUNTER — Ambulatory Visit (HOSPITAL_COMMUNITY)
Admission: EM | Admit: 2021-02-26 | Discharge: 2021-02-26 | Payer: Medicaid Other | Attending: Emergency Medicine | Admitting: Emergency Medicine

## 2021-02-26 ENCOUNTER — Encounter (HOSPITAL_COMMUNITY): Payer: Self-pay

## 2021-02-26 ENCOUNTER — Other Ambulatory Visit: Payer: Self-pay

## 2021-02-26 DIAGNOSIS — R1010 Upper abdominal pain, unspecified: Secondary | ICD-10-CM | POA: Diagnosis not present

## 2021-02-26 MED ORDER — KETOROLAC TROMETHAMINE 30 MG/ML IJ SOLN
30.0000 mg | Freq: Once | INTRAMUSCULAR | Status: AC
  Administered 2021-02-26: 30 mg via INTRAMUSCULAR

## 2021-02-26 MED ORDER — KETOROLAC TROMETHAMINE 30 MG/ML IJ SOLN
INTRAMUSCULAR | Status: AC
  Filled 2021-02-26: qty 1

## 2021-02-26 NOTE — ED Notes (Signed)
Patient is being discharged from the Urgent Care and sent to the Emergency Department via personal vehicle . Per Dr Chaney Malling, patient is in need of higher level of care due to severe abdominal pain. Patient is aware and verbalizes understanding of plan of care.   Vitals:   02/26/21 1728  BP: 119/76  Resp: 20  Temp: 100 F (37.8 C)  SpO2: 100%

## 2021-02-26 NOTE — ED Triage Notes (Signed)
Pt reports epigastric pain, lower abdominal pain, nausea, vomited 1 time since this morning. Pt denies chest pain, vision changes, dizziness, weakness.

## 2021-02-26 NOTE — Discharge Instructions (Addendum)
I am concerned that you could have inflammation/infection of your gallbladder or inflammation of your pancreas.  Go to any 1 of these ERs.  I have given you 30 mg of Toradol for pain.  Do not have anything to eat or drink until your ER evaluation is complete.

## 2021-02-26 NOTE — ED Provider Notes (Signed)
HPI  SUBJECTIVE:  [name] is a 30 y.o. female who presents with intermittent, epigastric  abdominal pain, anorexia starting today accompanied with a cough, nausea, 1 episode of vomiting and 3 episodes of watery, nonbloody diarrhea.  She is unable to characterize the pain.  It does not migrate or radiate.  It lasts seconds.  No fevers, bodyaches, headaches, nasal congestion or rhinorrhea, loss of sense of smell or taste.  She had a sore throat yesterday, but it has resolved.  She reports shortness of breath secondary to the abdominal pain.  No chest pain.  No abdominal distention, back pain.  No urinary complaints.  No raw or undercooked foods, questionable leftovers.  No COVID exposure.  She got the COVID booster last year.  Has not tried anything for this.  No alleviating factors.  Symptoms are worse with coughing, stooling.  It is not associated with walking or movement.  She states that the car ride over here was painful.  She is status post C-section.  No history of diabetes, hypertension, pulmonary disease, peptic ulcer disease, gallbladder disease, pancreatitis, MI, coronary disease, hypercholesterolemia, smoking.  LMP: Last week.  Denies possibility being pregnant.  PMD: None    Past Medical History:  Diagnosis Date  . Pregnancy induced hypertension     Past Surgical History:  Procedure Laterality Date  . CESAREAN SECTION    . CESAREAN SECTION N/A 10/17/2014   Procedure: REPEAT CESAREAN SECTION;  Surgeon: Kathreen Cosier, MD;  Location: [facility];  Service: Obstetrics;  Laterality: N/A;  . CESAREAN SECTION N/A 03/17/2018   Procedure: REPEAT CESAREAN SECTION;  Surgeon: [name], MD;  Location: [facility] BIRTHING SUIT[facility];  Service: Obstetrics;  Laterality: N/A;  Heather RNFA    Family History  Problem Relation Age of Onset  . Arthritis Mother   . Hypertension Mother     Social History   Tobacco Use  . Smoking status: Never Smoker  . Smokeless tobacco: Never Used  Substance  Use Topics  . Alcohol use: Yes  . Drug use: No     Current Facility-Administered Medications:  .  ketorolac (TORADOL) 30 MG/ML injection 30 mg, 30 mg, Intramuscular, Once, Domenick Gong, MD  Current Outpatient Medications:  .  IRON PO, Take 1 tablet by mouth 2 (two) times daily., Disp: , Rfl:  .  methocarbamol (ROBAXIN) 500 MG tablet, Take 1 tablet (500 mg total) by mouth 2 (two) times daily., Disp: 20 tablet, Rfl: 0 .  omeprazole (PRILOSEC) 20 MG capsule, Take 1 capsule (20 mg total) by mouth daily., Disp: 30 capsule, Rfl: 0 .  ondansetron (ZOFRAN) 4 MG tablet, Take 1 tablet (4 mg total) by mouth every 8 (eight) hours as needed for nausea or vomiting., Disp: 10 tablet, Rfl: 0 .  Prenatal Vit-Fe Fumarate-FA (PRENATAL COMPLETE) 14-0.4 MG TABS, Take 1 tablet by mouth 2 (two) times daily., Disp: 60 each, Rfl: 8  No Known Allergies   ROS  As noted in HPI.   Physical Exam  BP 119/76 (BP Location: Right Arm)   Temp 100 F (37.8 C) (Oral)   Resp 20   LMP  (Within Weeks) Comment: 1 week  SpO2 100%   Breastfeeding No   Constitutional: Well developed, well nourished, appears ill and uncomfortable Eyes:  EOMI, conjunctiva normal bilaterally HENT: Normocephalic, atraumatic,mucus membranes moist Respiratory: Normal inspiratory effort, lungs clear bilaterally Cardiovascular: Normal rate, regular rhythm. GI: Normal appearance.  Soft, nondistended.  Positive right upper quadrant and periumbilical tenderness.  Positive Murphy.  Negative McBurney.  Hypoactive bowel sounds.  Voluntary guarding.  No rebound. Back: No CVAT skin: No rash, skin intact Musculoskeletal: no deformities Neurologic: Alert & oriented x 3, no focal neuro deficits Psychiatric: Speech and behavior appropriate   ED Course   Medications  ketorolac (TORADOL) 30 MG/ML injection 30 mg (has no administration in time range)    No orders of the defined types were placed in this encounter.   No results found for  this or any previous visit (from the past 24 hour(s)). No results found.  ED Clinical Impression  1. Pain of upper abdomen      ED Assessment/Plan  Concern for cholecystitis or pancreatitis.  COVID in the differential.  Patient has a low-grade fever here.  Giving 30 mg of Toradol IM x1 here for pain.  Transferring to the emergency department for labs, imaging.  Advised patient to be n.p.o. until ER evaluation is complete.  Feel that she is stable to go via private vehicle.  Emphasized importance of going to the ED now.  She agrees to go.  Meds ordered this encounter  Medications  . ketorolac (TORADOL) 30 MG/ML injection 30 mg      *This clinic note was created using Scientist, clinical (histocompatibility and immunogenetics). Therefore, there may be occasional mistakes despite careful proofreading.  ?   Domenick Gong, MD 02/26/21 [phone]

## 2022-01-01 ENCOUNTER — Emergency Department (HOSPITAL_COMMUNITY): Payer: Medicaid Other

## 2022-01-01 ENCOUNTER — Other Ambulatory Visit: Payer: Self-pay

## 2022-01-01 ENCOUNTER — Emergency Department (HOSPITAL_COMMUNITY)
Admission: EM | Admit: 2022-01-01 | Discharge: 2022-01-01 | Disposition: A | Discharge status: Home / Self Care | Payer: Medicaid Other | Attending: Emergency Medicine | Admitting: Emergency Medicine

## 2022-01-01 DIAGNOSIS — Z20822 Contact with and (suspected) exposure to covid-19: Secondary | ICD-10-CM | POA: Diagnosis not present

## 2022-01-01 DIAGNOSIS — R000 Tachycardia, unspecified: Secondary | ICD-10-CM | POA: Insufficient documentation

## 2022-01-01 DIAGNOSIS — N83202 Unspecified ovarian cyst, left side: Secondary | ICD-10-CM | POA: Diagnosis not present

## 2022-01-01 DIAGNOSIS — R109 Unspecified abdominal pain: Secondary | ICD-10-CM

## 2022-01-01 DIAGNOSIS — N739 Female pelvic inflammatory disease, unspecified: Secondary | ICD-10-CM | POA: Diagnosis not present

## 2022-01-01 DIAGNOSIS — N730 Acute parametritis and pelvic cellulitis: Secondary | ICD-10-CM

## 2022-01-01 DIAGNOSIS — D72829 Elevated white blood cell count, unspecified: Secondary | ICD-10-CM | POA: Diagnosis not present

## 2022-01-01 DIAGNOSIS — R1030 Lower abdominal pain, unspecified: Secondary | ICD-10-CM | POA: Diagnosis present

## 2022-01-01 DIAGNOSIS — R102 Pelvic and perineal pain: Secondary | ICD-10-CM

## 2022-01-01 LAB — URINALYSIS, ROUTINE W REFLEX MICROSCOPIC
Bilirubin Urine: NEGATIVE
Glucose, UA: NEGATIVE mg/dL
Ketones, ur: 5 mg/dL — AB
Nitrite: NEGATIVE
Protein, ur: 30 mg/dL — AB
RBC / HPF: >50 RBC/hpf — ABNORMAL HIGH (ref 0–5)
Specific Gravity, Urine: 1.011 (ref 1.005–1.030)
pH: 8 (ref 5.0–8.0)

## 2022-01-01 LAB — LACTIC ACID, PLASMA
Lactic Acid, Venous: 1.2 mmol/L (ref 0.5–1.9)
Lactic Acid, Venous: 1 mmol/L (ref 0.5–1.9)

## 2022-01-01 LAB — CBC WITH DIFFERENTIAL/PLATELET
Abs Immature Granulocytes: 0.06 10*3/uL (ref 0.00–0.07)
Basophils Absolute: 0 10*3/uL (ref 0.0–0.1)
Basophils Relative: 0 %
Eosinophils Absolute: 0 10*3/uL (ref 0.0–0.5)
Eosinophils Relative: 0 %
HCT: 31.9 % — ABNORMAL LOW (ref 36.0–46.0)
Hemoglobin: 9.3 g/dL — ABNORMAL LOW (ref 12.0–15.0)
Immature Granulocytes: 0 %
Lymphocytes Relative: 7 %
Lymphs Abs: 1.2 10*3/uL (ref 0.7–4.0)
MCH: 21.9 pg — ABNORMAL LOW (ref 26.0–34.0)
MCHC: 29.2 g/dL — ABNORMAL LOW (ref 30.0–36.0)
MCV: 75.1 fL — ABNORMAL LOW (ref 80.0–100.0)
Monocytes Absolute: 0.8 10*3/uL (ref 0.1–1.0)
Monocytes Relative: 5 %
Neutro Abs: 14.7 10*3/uL — ABNORMAL HIGH (ref 1.7–7.7)
Neutrophils Relative %: 88 %
Platelets: 416 10*3/uL — ABNORMAL HIGH (ref 150–400)
RBC: 4.25 MIL/uL (ref 3.87–5.11)
RDW: 18.4 % — ABNORMAL HIGH (ref 11.5–15.5)
WBC: 16.9 10*3/uL — ABNORMAL HIGH (ref 4.0–10.5)
nRBC: 0 % (ref 0.0–0.2)

## 2022-01-01 LAB — APTT: aPTT: 25 seconds (ref 24–36)

## 2022-01-01 LAB — WET PREP, GENITAL
Sperm: NONE SEEN
Trich, Wet Prep: NONE SEEN
WBC, Wet Prep HPF POC: <10 (ref <10)
Yeast Wet Prep HPF POC: NONE SEEN

## 2022-01-01 LAB — COMPREHENSIVE METABOLIC PANEL
ALT: 13 U/L (ref 0–44)
AST: 22 U/L (ref 15–41)
Albumin: 3.6 g/dL (ref 3.5–5.0)
Alkaline Phosphatase: 64 U/L (ref 38–126)
Anion gap: 10 (ref 5–15)
BUN: 6 mg/dL (ref 6–20)
CO2: 22 mmol/L (ref 22–32)
Calcium: 8.4 mg/dL — ABNORMAL LOW (ref 8.9–10.3)
Chloride: 103 mmol/L (ref 98–111)
Creatinine, Ser: 0.79 mg/dL (ref 0.44–1.00)
GFR, Estimated: >60 mL/min (ref >60)
Glucose, Bld: 107 mg/dL — ABNORMAL HIGH (ref 70–99)
Potassium: 4.1 mmol/L (ref 3.5–5.1)
Sodium: 135 mmol/L (ref 135–145)
Total Bilirubin: 0.4 mg/dL (ref 0.3–1.2)
Total Protein: 7.7 g/dL (ref 6.5–8.1)

## 2022-01-01 LAB — I-STAT BETA HCG BLOOD, ED (MC, WL, AP ONLY): I-stat hCG, quantitative: <5 m[IU]/mL (ref <5)

## 2022-01-01 LAB — RESP PANEL BY RT-PCR (FLU A&B, COVID) ARPGX2
Influenza A by PCR: NEGATIVE
Influenza B by PCR: NEGATIVE
SARS Coronavirus 2 by RT PCR: NEGATIVE

## 2022-01-01 LAB — PROTIME-INR
INR: 1 (ref 0.8–1.2)
Prothrombin Time: 13.2 seconds (ref 11.4–15.2)

## 2022-01-01 LAB — LIPASE, BLOOD: Lipase: 28 U/L (ref 11–51)

## 2022-01-01 MED ORDER — METRONIDAZOLE 500 MG/100ML IV SOLN
500.0000 mg | Freq: Once | INTRAVENOUS | Status: AC
  Administered 2022-01-01: 500 mg via INTRAVENOUS
  Filled 2022-01-01: qty 100

## 2022-01-01 MED ORDER — MORPHINE SULFATE (PF) 4 MG/ML IV SOLN
4.0000 mg | Freq: Once | INTRAVENOUS | Status: AC
  Administered 2022-01-01: 4 mg via INTRAVENOUS
  Filled 2022-01-01: qty 1

## 2022-01-01 MED ORDER — LACTATED RINGERS IV SOLN: INTRAVENOUS | Status: DC

## 2022-01-01 MED ORDER — IOHEXOL 300 MG/ML  SOLN
100.0000 mL | Freq: Once | INTRAMUSCULAR | Status: AC | PRN
  Administered 2022-01-01: 100 mL via INTRAVENOUS

## 2022-01-01 MED ORDER — IBUPROFEN 600 MG PO TABS: 600.0000 mg | ORAL_TABLET | Freq: Four times a day (QID) | ORAL | 0 refills | Status: DC | PRN

## 2022-01-01 MED ORDER — DOXYCYCLINE HYCLATE 100 MG PO CAPS: 100.0000 mg | ORAL_CAPSULE | Freq: Two times a day (BID) | ORAL | 0 refills | Status: DC

## 2022-01-01 MED ORDER — SODIUM CHLORIDE 0.9 % IV SOLN
2.0000 g | Freq: Once | INTRAVENOUS | Status: AC
  Administered 2022-01-01: 2 g via INTRAVENOUS
  Filled 2022-01-01: qty 20

## 2022-01-01 NOTE — ED Provider Notes (Signed)
?[facility] EMERGENCY DEPARTMENT ?Provider Note ? ? ?CSN: [id number] ?Arrival date & time: 01/01/22  1157 ? ?  ? ?History ? ?Chief Complaint  ?Patient presents with  ? Abdominal Pain  ? ? ?[name] is a 31 y.o. female. ? ?The history is provided by the patient and medical records. No language interpreter was used.  ?Abdominal Pain ? ?31 year old female presenting with complaints of abdominal pain.  Patient endorsed acute onset of pain along her lower pannus C-section site that started earlier this morning.  Pain is sharp, persistent, nonradiating, and intense.  States when pain is severe she feels like she cannot breathe.  She mentioned having similar pain like this in the past but never this intense.  She denies any associated fever or chills no chest pain productive cough hemoptysis back pain dysuria hematuria vaginal bleeding or vaginal discharge.  Last menstrual period was 3 weeks ago.  She denies any recent strength activities or heavy lifting.  No prior history of PE or DVT.  No history of SBO.  Last normal bowel movement was this morning.  Denies nausea or vomiting. ? ?Home Medications ?Prior to Admission medications   ?Medication Sig Start Date End Date Taking? Authorizing Provider  ?IRON PO Take 1 tablet by mouth 2 (two) times daily.    [provider]  ?methocarbamol (ROBAXIN) 500 MG tablet Take 1 tablet (500 mg total) by mouth 2 (two) times daily. 08/01/18   Janne Napoleon, NP  ?omeprazole (PRILOSEC) 20 MG capsule Take 1 capsule (20 mg total) by mouth daily. 06/20/20   Georgetta Haber, NP  ?ondansetron (ZOFRAN) 4 MG tablet Take 1 tablet (4 mg total) by mouth every 8 (eight) hours as needed for nausea or vomiting. 06/20/20   Georgetta Haber, NP  ?Prenatal Vit-Fe Fumarate-FA (PRENATAL COMPLETE) 14-0.4 MG TABS Take 1 tablet by mouth 2 (two) times daily. 08/13/17   Marlon Pel, PA-C  ?   ? ?Allergies    ?Patient has no known allergies.   ? ?Review of Systems   ?Review of  Systems  ?Gastrointestinal:  Positive for abdominal pain.  ?All other systems reviewed and are negative. ? ?Physical Exam ?Updated Vital Signs ?BP (!) 143/82   Pulse (!) 106   Temp 99.5 ?F (37.5 ?C)   Resp 16   SpO2 99%  ?Physical Exam ?Vitals and nursing note reviewed.  ?Constitutional:   ?   General: She is not in acute distress. ?   Appearance: She is well-developed.  ?HENT:  ?   Head: Atraumatic.  ?Eyes:  ?   Conjunctiva/sclera: Conjunctivae normal.  ?Cardiovascular:  ?   Rate and Rhythm: Tachycardia present.  ?Pulmonary:  ?   Effort: Pulmonary effort is normal.  ?Abdominal:  ?   Palpations: Abdomen is soft.  ?   Tenderness: There is abdominal tenderness (Tenderness along lower pannus at the C-section surgical site without any signs of hernia, cellulitis, or overlying skin changes.).  ?Musculoskeletal:  ?   Cervical back: Neck supple.  ?Skin: ?   Findings: No rash.  ?Neurological:  ?   Mental Status: She is alert.  ?Psychiatric:     ?   Mood and Affect: Mood normal.  ? ? ?ED Results / Procedures / Treatments   ?Labs ?(all labs ordered are listed, but only abnormal results are displayed) ?Labs Reviewed  ?WET PREP, GENITAL - Abnormal; Notable for the following components:  ?    Result Value  ? Clue Cells Wet Prep  HPF POC PRESENT (*)   ? All other components within normal limits  ?CBC WITH DIFFERENTIAL/PLATELET - Abnormal; Notable for the following components:  ? WBC 16.9 (*)   ? Hemoglobin 9.3 (*)   ? HCT 31.9 (*)   ? MCV 75.1 (*)   ? MCH 21.9 (*)   ? MCHC 29.2 (*)   ? RDW 18.4 (*)   ? Platelets 416 (*)   ? Neutro Abs 14.7 (*)   ? All other components within normal limits  ?COMPREHENSIVE METABOLIC PANEL - Abnormal; Notable for the following components:  ? Glucose, Bld 107 (*)   ? Calcium 8.4 (*)   ? All other components within normal limits  ?URINALYSIS, ROUTINE W REFLEX MICROSCOPIC - Abnormal; Notable for the following components:  ? APPearance HAZY (*)   ? Hgb urine dipstick LARGE (*)   ? Ketones, ur 5 (*)    ? Protein, ur 30 (*)   ? Leukocytes,Ua TRACE (*)   ? RBC / HPF >50 (*)   ? Bacteria, UA RARE (*)   ? All other components within normal limits  ?CULTURE, BLOOD (ROUTINE X 2)  ?CULTURE, BLOOD (ROUTINE X 2)  ?RESP PANEL BY RT-PCR (FLU A&B, COVID) ARPGX2  ?URINE CULTURE  ?LIPASE, BLOOD  ?LACTIC ACID, PLASMA  ?PROTIME-INR  ?APTT  ?LACTIC ACID, PLASMA  ?I-STAT BETA HCG BLOOD, ED ([facility], [facility], [facility] ONLY)  ?GC/CHLAMYDIA PROBE AMP ([facility]) NOT AT [facility]  ? ? ?EKG ?None ? ?Radiology ?DG Chest 1 View ? ?Result Date: 01/01/2022 ?CLINICAL DATA:  Questionable sepsis - evaluate for abnormality EXAM: CHEST  1 VIEW COMPARISON:  None. FINDINGS: Limited evaluation of the left lung base. No definite consolidation. No visible pleural effusions or pneumothorax. Enlarged cardiac silhouette. IMPRESSION: 1. Limited evaluation of the left lung base without definite evidence of acute cardiopulmonary abnormality. Dedicated PA and lateral radiographs could further evaluate if clinically indicated. 2. Cardiomegaly. Electronically Signed   By: Feliberto HartsFrederick S Jones M.D.   On: 01/01/2022 14:40  ? ?US PELVIS (TRANSABDOMINAL ONLY) ? ?Result Date: 01/01/2022 ?CLINICAL DATA:  Abdominal pain. Bilateral lower abdominal pain. Questionable LEFT ovary inflammation on CT exam. EXAM: TRANSABDOMINAL ULTRASOUND OF PELVIS DOPPLER ULTRASOUND OF OVARIES TECHNIQUE: Transabdominal ultrasound examination of the pelvis was performed including evaluation of the uterus, ovaries, adnexal regions, and pelvic cul-de-sac. Color and duplex Doppler ultrasound was utilized to evaluate blood flow to the ovaries. COMPARISON:  CT 01/01/2022 FINDINGS: Uterus Measurements: 10.4 x 4.4 x 6.1 cm = volume: 156 mL. No fibroids or other mass visualized. Endometrium Thickness: 14 mm.  Normal thickness for premenopausal female. Right ovary Measurements: 4.0 x 2.0 x 2.2 cm = volume: 9.2 mL. Normal appearance. Left ovary Measurements: 5.3 x 3.8 x 6.1 cm = volume: 64 mL. The ovary is mildly  enlarged. There is anechoic cyst centrally within the ovary. No nodularity identified. The cystic component measures 3.7 x 2.7 x 1.7 cm. No flow within the cyst. Normal flow to the LEFT ovarian parenchyma. Normal flow to the RIGHT ovary. Small amount of free fluid in the LEFT adnexa Pulsed Doppler evaluation demonstrates normal low-resistance arterial and venous waveforms in both ovaries. Other: Small free fluid.  Patient refused transvaginal exam IMPRESSION: 1. Normal vascular flow to the LEFT and RIGHT ovary. 2. Benign appearing cyst centrally within the LEFT ovary. Favor involuting functional ovarian cyst. No follow-up imaging is recommended. Reference: Radiology 2019 Nov;293(2):359-371 3. Small amount free fluid in the LEFT adnexa related to the cyst described above. Electronically Signed  By: Genevive Bi M.D.   On: 01/01/2022 16:10  ? ?CT ABDOMEN PELVIS W CONTRAST ? ?Result Date: 01/01/2022 ?CLINICAL DATA:  Abdominal pain, nonlocalized EXAM: CT ABDOMEN AND PELVIS WITH CONTRAST TECHNIQUE: Multidetector CT imaging of the abdomen and pelvis was performed using the standard protocol following bolus administration of intravenous contrast. RADIATION DOSE REDUCTION: This exam was performed according to the departmental dose-optimization program which includes automated exposure control, adjustment of the mA and/or kV according to patient size and/or use of iterative reconstruction technique. CONTRAST:  OMNIPAQUE IOHEXOL 300 MG/ML  SOLN COMPARISON:  None. FINDINGS: Lower chest: Lung bases are clear. Hepatobiliary: No focal hepatic lesion. No biliary duct dilatation. Common bile duct is normal. Pancreas: Pancreas is normal. No ductal dilatation. No pancreatic inflammation. Spleen: Normal spleen Adrenals/urinary tract: Adrenal glands and kidneys are normal. The ureters and bladder normal. Stomach/Bowel: Stomach, duodenum and small-bowel normal. Terminal ileum normal. Normal appendix extends inferior to the  cecum. Multiple diverticula of the descending colon and sigmoid colon without acute inflammation. Vascular/Lymphatic: Abdominal aorta is normal caliber. No periportal or retroperitoneal adenopathy. No pelvic adenopath

## 2022-01-01 NOTE — ED Triage Notes (Signed)
Pt here POV d/t abdominal pain since this morning. Tender to touch. 10/10. Denies urinary symptoms, BM regular. Pt tearful. Pt states when pain shoots it makes her SOB ?

## 2022-01-01 NOTE — Discharge Instructions (Addendum)
Your pain could be related to a ruptured ovarian cyst.  Take ibuprofen as needed for pain control as it should improve within the next few days.  Check MyChart for results of chlamydia/gonorrhea and if you test positive for either one of them, take doxycycline for 2 weeks as treatment.  Return to the ER if you have any concern. ?

## 2022-01-02 LAB — GC/CHLAMYDIA PROBE AMP (~~LOC~~) NOT AT ARMC
Chlamydia: NEGATIVE
Comment: NEGATIVE
Comment: NORMAL
Neisseria Gonorrhea: NEGATIVE

## 2022-01-03 LAB — URINE CULTURE

## 2022-01-06 LAB — CULTURE, BLOOD (ROUTINE X 2)
Culture: NO GROWTH
Culture: NO GROWTH
Special Requests: ADEQUATE
Special Requests: ADEQUATE

## 2022-02-12 ENCOUNTER — Emergency Department (HOSPITAL_COMMUNITY)
Admission: EM | Admit: 2022-02-12 | Discharge: 2022-02-13 | Disposition: A | Discharge status: Home / Self Care | Payer: Medicaid Other | Attending: Emergency Medicine | Admitting: Emergency Medicine

## 2022-02-12 DIAGNOSIS — I517 Cardiomegaly: Secondary | ICD-10-CM

## 2022-02-12 DIAGNOSIS — R0789 Other chest pain: Secondary | ICD-10-CM | POA: Insufficient documentation

## 2022-02-12 DIAGNOSIS — R791 Abnormal coagulation profile: Secondary | ICD-10-CM | POA: Insufficient documentation

## 2022-02-12 DIAGNOSIS — R0781 Pleurodynia: Secondary | ICD-10-CM | POA: Diagnosis present

## 2022-02-13 ENCOUNTER — Other Ambulatory Visit: Payer: Self-pay

## 2022-02-13 ENCOUNTER — Emergency Department (HOSPITAL_COMMUNITY): Payer: Medicaid Other

## 2022-02-13 ENCOUNTER — Encounter (HOSPITAL_COMMUNITY): Payer: Self-pay | Admitting: Emergency Medicine

## 2022-02-13 LAB — CBC WITH DIFFERENTIAL/PLATELET
Abs Immature Granulocytes: 0.03 10*3/uL (ref 0.00–0.07)
Basophils Absolute: 0 10*3/uL (ref 0.0–0.1)
Basophils Relative: 0 %
Eosinophils Absolute: 0.1 10*3/uL (ref 0.0–0.5)
Eosinophils Relative: 1 %
HCT: 33.6 % — ABNORMAL LOW (ref 36.0–46.0)
Hemoglobin: 9.3 g/dL — ABNORMAL LOW (ref 12.0–15.0)
Immature Granulocytes: 0 %
Lymphocytes Relative: 27 %
Lymphs Abs: 2.4 10*3/uL (ref 0.7–4.0)
MCH: 21.1 pg — ABNORMAL LOW (ref 26.0–34.0)
MCHC: 27.7 g/dL — ABNORMAL LOW (ref 30.0–36.0)
MCV: 76.4 fL — ABNORMAL LOW (ref 80.0–100.0)
Monocytes Absolute: 0.6 10*3/uL (ref 0.1–1.0)
Monocytes Relative: 7 %
Neutro Abs: 5.7 10*3/uL (ref 1.7–7.7)
Neutrophils Relative %: 65 %
Platelets: 442 10*3/uL — ABNORMAL HIGH (ref 150–400)
RBC: 4.4 MIL/uL (ref 3.87–5.11)
RDW: 18.3 % — ABNORMAL HIGH (ref 11.5–15.5)
WBC: 8.8 10*3/uL (ref 4.0–10.5)
nRBC: 0 % (ref 0.0–0.2)

## 2022-02-13 LAB — BASIC METABOLIC PANEL
Anion gap: 8 (ref 5–15)
BUN: 9 mg/dL (ref 6–20)
CO2: 26 mmol/L (ref 22–32)
Calcium: 8.3 mg/dL — ABNORMAL LOW (ref 8.9–10.3)
Chloride: 103 mmol/L (ref 98–111)
Creatinine, Ser: 0.9 mg/dL (ref 0.44–1.00)
GFR, Estimated: >60 mL/min (ref >60)
Glucose, Bld: 128 mg/dL — ABNORMAL HIGH (ref 70–99)
Potassium: 4.1 mmol/L (ref 3.5–5.1)
Sodium: 137 mmol/L (ref 135–145)

## 2022-02-13 LAB — TROPONIN I (HIGH SENSITIVITY)
Troponin I (High Sensitivity): 6 ng/L (ref <18)
Troponin I (High Sensitivity): 5 ng/L (ref <18)

## 2022-02-13 LAB — PREGNANCY, URINE: Preg Test, Ur: NEGATIVE

## 2022-02-13 LAB — D-DIMER, QUANTITATIVE: D-Dimer, Quant: 3.25 ug/mL-FEU — ABNORMAL HIGH (ref 0.00–0.50)

## 2022-02-13 MED ORDER — IOHEXOL 350 MG/ML SOLN
100.0000 mL | Freq: Once | INTRAVENOUS | Status: AC | PRN
  Administered 2022-02-13: 100 mL via INTRAVENOUS

## 2022-02-13 NOTE — ED Provider Notes (Signed)
?[facility] EMERGENCY DEPARTMENT ?Provider Note ? ? ?CSN: [id number] ?Arrival date & time: 02/12/22  2306 ? ?  ? ?History ? ?Chief Complaint  ?Patient presents with  ? Chest Pain  ? ? ?[name] is a 31 y.o. female. ? ?Patient presents to the emergency department for evaluation of left-sided chest and rib area pain.  Patient reports that she suddenly felt the pain when she stood up.  Pain has been present ever since.  She has noticed pain when she turns her torso to the right, bends over.  The area is tender to the touch.  She has some pain when she takes breath but is not short of breath.  She denies any injury.  No cough, chest congestion. ? ? ?  ? ?Home Medications ?Prior to Admission medications   ?Medication Sig Start Date End Date Taking? Authorizing Provider  ?ibuprofen (ADVIL) 600 MG tablet Take 1 tablet (600 mg total) by mouth every 6 (six) hours as needed. ?Patient taking differently: Take 600 mg by mouth every 6 (six) hours as needed for headache or moderate pain. 01/01/22  Yes Fayrene Helper, PA-C  ?doxycycline (VIBRAMYCIN) 100 MG capsule Take 1 capsule (100 mg total) by mouth 2 (two) times daily. One po bid x 7 days ?Patient not taking: Reported on 02/13/2022 01/01/22   Fayrene Helper, PA-C  ?   ? ?Allergies    ?Patient has no known allergies.   ? ?Review of Systems   ?Review of Systems  ?Cardiovascular:  Positive for chest pain.  ? ?Physical Exam ?Updated Vital Signs ?BP (!) 166/115   Pulse (!) 101   Temp 99.3 ?F (37.4 ?C) (Oral)   Resp 18   SpO2 100%  ?Physical Exam ?Vitals and nursing note reviewed.  ?Constitutional:   ?   General: She is not in acute distress. ?   Appearance: She is well-developed.  ?HENT:  ?   Head: Normocephalic and atraumatic.  ?   Mouth/Throat:  ?   Mouth: Mucous membranes are moist.  ?Eyes:  ?   General: Vision grossly intact. Gaze aligned appropriately.  ?   Extraocular Movements: Extraocular movements intact.  ?   Conjunctiva/sclera: Conjunctivae normal.   ?Cardiovascular:  ?   Rate and Rhythm: Normal rate and regular rhythm.  ?   Pulses: Normal pulses.  ?   Heart sounds: Normal heart sounds, S1 normal and S2 normal. No murmur heard. ?  No friction rub. No gallop.  ?Pulmonary:  ?   Effort: Pulmonary effort is normal. No respiratory distress.  ?   Breath sounds: Normal breath sounds.  ?Chest:  ?   Chest wall: Tenderness present. No deformity, swelling or crepitus.  ?   Comments: No dermatomal rash ?Abdominal:  ?   General: Bowel sounds are normal.  ?   Palpations: Abdomen is soft.  ?   Tenderness: There is no abdominal tenderness. There is no guarding or rebound.  ?   Hernia: No hernia is present.  ?Musculoskeletal:     ?   General: No swelling.  ?   Cervical back: Full passive range of motion without pain, normal range of motion and neck supple. No spinous process tenderness or muscular tenderness. Normal range of motion.  ?   Right lower leg: No edema.  ?   Left lower leg: No edema.  ?Skin: ?   General: Skin is warm and dry.  ?   Capillary Refill: Capillary refill takes less than 2 seconds.  ?  Findings: No ecchymosis, erythema, rash or wound.  ?Neurological:  ?   General: No focal deficit present.  ?   Mental Status: She is alert and oriented to person, place, and time.  ?   GCS: GCS eye subscore is 4. GCS verbal subscore is 5. GCS motor subscore is 6.  ?   Cranial Nerves: Cranial nerves 2-12 are intact.  ?   Sensory: Sensation is intact.  ?   Motor: Motor function is intact.  ?   Coordination: Coordination is intact.  ?Psychiatric:     ?   Attention and Perception: Attention normal.     ?   Mood and Affect: Mood normal.     ?   Speech: Speech normal.     ?   Behavior: Behavior normal.  ? ? ?ED Results / Procedures / Treatments   ?Labs ?(all labs ordered are listed, but only abnormal results are displayed) ?Labs Reviewed  ?BASIC METABOLIC PANEL - Abnormal; Notable for the following components:  ?    Result Value  ? Glucose, Bld 128 (*)   ? Calcium 8.3 (*)   ? All  other components within normal limits  ?CBC WITH DIFFERENTIAL/PLATELET - Abnormal; Notable for the following components:  ? Hemoglobin 9.3 (*)   ? HCT 33.6 (*)   ? MCV 76.4 (*)   ? MCH 21.1 (*)   ? MCHC 27.7 (*)   ? RDW 18.3 (*)   ? Platelets 442 (*)   ? All other components within normal limits  ?D-DIMER, QUANTITATIVE (NOT AT [facility]) - Abnormal; Notable for the following components:  ? D-Dimer, Quant 3.25 (*)   ? All other components within normal limits  ?PREGNANCY, URINE  ?TROPONIN I (HIGH SENSITIVITY)  ?TROPONIN I (HIGH SENSITIVITY)  ? ? ?EKG ?EKG Interpretation ? ?Date/Time:  Thursday February 13 2022 01:56:24 EDT ?Ventricular Rate:  94 ?PR Interval:  172 ?QRS Duration: 74 ?QT Interval:  382 ?QTC Calculation: 477 ?R Axis:   94 ?Text Interpretation: Normal sinus rhythm Rightward axis Septal infarct , age undetermined Abnormal ECG No previous ECGs available Confirmed by Gilda CreasePollina, Adrean Findlay J [id number]) on 02/13/2022 4:39:03 AM ? ?Radiology ?DG Ribs Unilateral W/Chest Left ? ?Result Date: 02/13/2022 ?CLINICAL DATA:  Mid axillary rib pain EXAM: LEFT RIBS AND CHEST - 3+ VIEW COMPARISON:  01/01/2022 FINDINGS: Cardiomegaly, vascular congestion. No confluent opacities, effusions or overt edema. No acute bony abnormality. No visible rib fracture. No pneumothorax. IMPRESSION: Cardiomegaly, vascular congestion. No visible rib fracture. Electronically Signed   By: Charlett NoseKevin  Dover M.D.   On: 02/13/2022 01:00   ? ?Procedures ?Procedures  ? ? ?Medications Ordered in ED ?Medications - No data to display ? ?ED Course/ Medical Decision Making/ A&P ?  ?                        ?Medical Decision Making ? ?Patient presents to the emergency department for evaluation of left-sided chest wall pain.  Patient denies injury but the area is tender to the touch.  Pain is reproduced with movement of the torso indicating external chest wall etiology.  Patient does not have any PE risk factors, no recent immobilization, surgery, exogenous  estrogen. ? ?D-dimer is, however, elevated.  We will perform CT angiography to rule out PE.  Signed out to oncoming ER physician ? ? ? ? ? ? ? ?Final Clinical Impression(s) / ED Diagnoses ?Final diagnoses:  ?Chest wall pain  ? ? ?Rx / DC Orders ?ED  Discharge Orders   ? ? None  ? ?  ? ? ?  ?Gilda Crease, MD ?02/13/22 [phone] ? ?

## 2022-02-13 NOTE — ED Triage Notes (Signed)
Pt reports left sided rib pain that started last night.  Reports some pain w/ breathing.  No apparent injuries noted. ?

## 2022-02-13 NOTE — Discharge Instructions (Addendum)
Your CT scan shows your heart is enlarged compared to what it should be (cardiomegaly). You need to follow up with a cardiologist and are being referred to one.  ? ?If you develop recurrent, continued, or worsening chest pain, shortness of breath, fever, vomiting, abdominal or back pain, or any other new/concerning symptoms then return to the ER for evaluation.  ?

## 2022-02-13 NOTE — ED Provider Notes (Signed)
Care transferred to me. CTA images viewed by myself, no obvious PE. She is noted to have cardiomegaly, so she'll be referred to cardiology. Has some mild HTN here, but reports no prior history and chart review shows no significant HTN history. Will have her follow up with PCP for repeat BP check. Given return precautions.   Pricilla Loveless, MD 02/13/22 [phone]

## 2022-02-13 NOTE — ED Provider Triage Note (Signed)
Emergency Medicine Provider Triage Evaluation Note ? ?[name] , a 31 y.o. female  was evaluated in triage.  Pt complains of left-sided rib pain, started suddenly yesterday as she got up from the couch, states she was sitting up while the pain started, pain is remained consistent, does not radiate, she endorses shortness of breath and pleuritic chest pain, she denies  actual chest pain itself, no leg swelling, no leg pain, not on hormone therapy, no history of PEs or DVTs.  She denies any recent travels or surgeries.  Denies any trauma to the area, never had this in the past.. ? ?Review of Systems  ?Positive: Rib pain, shortness of breath ?Negative: Chest pain, diaphoretic ? ?Physical Exam  ?BP (!) 151/90 (BP Location: Right Arm)   Pulse 99   Temp 99.3 ?F (37.4 ?C) (Oral)   Resp (!) 22   SpO2 94%  ?Gen:   Awake, no distress   ?Resp:  Normal effort  ?MSK:   Moves extremities without difficulty  ?Other:   ? ?Medical Decision Making  ?Medically screening exam initiated at 12:37 AM.  Appropriate orders placed.  [name] was informed that the remainder of the evaluation will be completed by another provider, this initial triage assessment does not replace that evaluation, and the importance of remaining in the ED until their evaluation is complete. ? ?Presents with left rib pain, lab work and imaging have been ordered will need further work-up. ?  ?Carroll Sage, PA-C ?02/13/22 0038 ? ?

## 2022-02-27 NOTE — Progress Notes (Signed)
?Cardiology Office Note:   ? ?Date:  02/28/2022  ? ?ID:  [name], DOB 1991/06/04, MRN [id number] ? ?[facility]  ?Cardiologist:  Lesleigh NoeHenry W Davidlee Jeanbaptiste III, MD  ? ?Referring MD: Department, [place]  ? ?No chief complaint on file. ? ? ?History of Present Illness:   ? ?[name] is a 31 y.o. female with a hx of cardiomegaly, pregnancy related hypertension, who is being referred for chest pain. ? ?The transfer of care emergency room note by Dr. Pricilla LovelessScott Goldston that generated this consultation:: ?"Care transferred to me. CTA images viewed by myself, no obvious PE. She is noted to have cardiomegaly, so she'll be referred to cardiology. Has some mild HTN here, but reports no prior history and chart review shows no significant HTN history. Will have her follow up with PCP for repeat BP check. Given return precautions." ? ?Nice 31 year old female with a longstanding history of difficulty sleeping.  She speaks with a nasal quality to her voice.  Her family complains that she snores loudly.  She has intermittent lower extremity swelling.  She is referred after visiting the emergency room with chest pain.  A CT scan was performed and demonstrated "cardiomegaly".  She denies orthopnea and PND.  She has frequent nocturia.  She is sedentary.  States that she has always been that way.  She will get short of breath if she walks too far. ? ?Past Medical History:  ?Diagnosis Date  ? Cardiomegaly   ? Chest pain   ? Pregnancy induced hypertension   ? ? ?Past Surgical History:  ?Procedure Laterality Date  ? CESAREAN SECTION    ? CESAREAN SECTION N/A 10/17/2014  ? Procedure: REPEAT CESAREAN SECTION;  Surgeon: Kathreen CosierBernard A Marshall, MD;  Location: [facility];  Service: Obstetrics;  Laterality: N/A;  ? CESAREAN SECTION N/A 03/17/2018  ? Procedure: REPEAT CESAREAN SECTION;  Surgeon: Waynard Reedsoss, Kendra, MD;  [facility];  Service: Obstetrics;  Laterality: N/A;  Heather RNFA  ? ? ?Current  Medications: ?Current Meds  ?Medication Sig  ? doxycycline (VIBRAMYCIN) 100 MG capsule Take 1 capsule (100 mg total) by mouth 2 (two) times daily. One po bid x 7 days  ?  ? ?Allergies:   Patient has no known allergies.  ? ?Social History  ? ?Socioeconomic History  ? Marital status: Single  ?  Spouse name: Not on file  ? Number of children: Not on file  ? Years of education: Not on file  ? Highest education level: Not on file  ?Occupational History  ? Not on file  ?Tobacco Use  ? Smoking status: Never  ? Smokeless tobacco: Never  ?Substance and Sexual Activity  ? Alcohol use: Yes  ? Drug use: No  ? Sexual activity: Yes  ?  Birth control/protection: Injection  ?Other Topics Concern  ? Not on file  ?Social History Narrative  ? Not on file  ? ?Social Determinants of Health  ? ?Financial Resource Strain: Not on file  ?Food Insecurity: Not on file  ?Transportation Needs: Not on file  ?Physical Activity: Not on file  ?Stress: Not on file  ?Social Connections: Not on file  ?  ? ?Family History: ?The patient's family history includes Arthritis in her mother; Hypertension in her mother. ? ?ROS:   ?Please see the history of present illness.    ?No history of heart trouble.  Has been pregnant but had no all other systems reviewed and are negative. ? ?EKGs/Labs/Other Studies Reviewed:   ? ?  The following studies were reviewed today: ? ?Single view chest x-ray on 01/01/2022: ?IMPRESSION: ?1. Limited evaluation of the left lung base without definite ?evidence of acute cardiopulmonary abnormality. Dedicated PA and ?lateral radiographs could further evaluate if clinically indicated. ?2. Cardiomegaly. ? ?Chest CT scan/20 04/2022: ?IMPRESSION: ?1. No filling defect is identified in the pulmonary arterial tree to ?suggest pulmonary embolus. ?2. Mild to moderate cardiomegaly. ?3. Minimal scarring or atelectasis in the lingula and right middle ?lobe. ? ?EKG:  EKG performed 02/13/2022 demonstrates normal sinus rhythm with slightly rightward  axis.  Prominent voltage.  EKG tracing performed today reveals right axis deviation, QS pattern V1 and V2, normal sinus rhythm, and no significant change compared to a month ago. ? ?Recent Labs: ?01/01/2022: ALT 13 ?02/13/2022: BUN 9; Creatinine, Ser 0.90; Hemoglobin 9.3; Platelets 442; Potassium 4.1; Sodium 137  ?Recent Lipid Panel ?No results found for: CHOL, TRIG, HDL, CHOLHDL, VLDL, LDLCALC, LDLDIRECT ? ?Physical Exam:   ? ?VS:  BP 122/88   Pulse 79   Ht 5\' 1"  (1.549 m)   Wt 245 lb 9.6 oz (111.4 kg)   SpO2 97%   BMI 46.41 kg/m?    ? ?Wt Readings from Last 3 Encounters:  ?02/28/22 245 lb 9.6 oz (111.4 kg)  ?01/01/22 218 lb (98.9 kg)  ?05/17/20 (!) 209 lb 3.2 oz (94.9 kg)  ?  ? ?GEN: Morbid obesity. No acute distress ?HEENT: Normal ?NECK: No JVD. ?LYMPHATICS: No lymphadenopathy ?CARDIAC: No murmur. RRR S4 but no S3 gallop, and trace bilateral shin to ankle edema. ?VASCULAR:  Normal Pulses. No bruits. ?RESPIRATORY:  Clear to auscultation without rales, wheezing or rhonchi  ?ABDOMEN: Soft, non-tender, non-distended, No pulsatile mass, ?MUSCULOSKELETAL: No deformity  ?SKIN: Warm and dry ?NEUROLOGIC:  Alert and oriented x 3 ?PSYCHIATRIC:  Normal affect  ? ?ASSESSMENT:   ? ?1. Chest pain of uncertain etiology   ?2. Cardiomegaly   ?3. Snoring   ?4. Pregnancy induced hypertension, antepartum   ?5. Nonspecific abnormal electrocardiogram (ECG) (EKG)   ? ?PLAN:   ? ?In order of problems listed above: ? ?Musculoskeletal is likely explanation.  Rule out relationship to pulmonary hypertension which is suspected based upon right axis deviation on EKG and history compatible with sleep apnea. ?Uncertain etiology.  2D Doppler echocardiogram to assess right and left heart size and function.  Would left to quantitate pulmonary artery pressures as I am suspicious that she will have pulmonary hypertension. ?Suspect obstructive sleep apnea.  Rule out obesity hypoventilation syndrome. ?Diastolic pressure is a little higher than it  should be.  This needs to be followed closely. ?EKG demonstrates right axis deviation with QRS axis around 100.  Otherwise unremarkable other than QS pattern V1 and V2. ? ?2D Doppler echocardiogram.  Phenotype suggests a chromosome abnormality.  Echocardiography will help determine if there is right heart enlargement which is my suspicion given right axis deviation.  She needs a sleep study to rule out obstructive sleep apnea. ? ? ?Medication Adjustments/Labs and Tests Ordered: ?Current medicines are reviewed at length with the patient today.  Concerns regarding medicines are outlined above.  ?Orders Placed This Encounter  ?Procedures  ? EKG 12-Lead  ? ECHOCARDIOGRAM COMPLETE  ? Home sleep test  ? ?No orders of the defined types were placed in this encounter. ? ? ?Patient Instructions  ?Medication Instructions:  ?Your physician recommends that you continue on your current medications as directed. Please refer to the Current Medication list given to you today. ? ?*If you need a refill  on your cardiac medications before your next appointment, please call your pharmacy* ? ?Lab Work: ?NONE ? ?Testing/Procedures: ?Your physician has requested you have a sleep study performed. ? ?Your physician has requested that you have an echocardiogram. Echocardiography is a painless test that uses sound waves to create images of your heart. It provides your doctor with information about the size and shape of your heart and how well your heart?s chambers and valves are working. This procedure takes approximately one hour. There are no restrictions for this procedure. ? ?Follow-Up: ?At [facility], you and your health needs are our priority.  As part of our continuing mission to provide you with exceptional heart care, we have created designated Provider Care Teams.  These Care Teams include your primary Cardiologist (physician) and Advanced Practice Providers (APPs -  Physician Assistants and Nurse Practitioners) who all work  together to provide you with the care you need, when you need it. ? ?Your next appointment:   ?1 month(s) ? ?The format for your next appointment:   ?In Person ? ?Provider:   ?Lesleigh Noe, MD { ? ? ?Important Informati

## 2022-02-28 ENCOUNTER — Ambulatory Visit (INDEPENDENT_AMBULATORY_CARE_PROVIDER_SITE_OTHER): Payer: Medicaid Other | Admitting: Interventional Cardiology

## 2022-02-28 ENCOUNTER — Encounter: Payer: Self-pay | Admitting: Interventional Cardiology

## 2022-02-28 VITALS — BP 122/88 | HR 79 | Ht 61.0 in | Wt 245.6 lb

## 2022-02-28 DIAGNOSIS — O139 Gestational [pregnancy-induced] hypertension without significant proteinuria, unspecified trimester: Secondary | ICD-10-CM

## 2022-02-28 DIAGNOSIS — R0683 Snoring: Secondary | ICD-10-CM

## 2022-02-28 DIAGNOSIS — I517 Cardiomegaly: Secondary | ICD-10-CM

## 2022-02-28 DIAGNOSIS — R079 Chest pain, unspecified: Secondary | ICD-10-CM | POA: Diagnosis not present

## 2022-02-28 DIAGNOSIS — R9431 Abnormal electrocardiogram [ECG] [EKG]: Secondary | ICD-10-CM

## 2022-02-28 NOTE — Patient Instructions (Signed)
Medication Instructions:  ?Your physician recommends that you continue on your current medications as directed. Please refer to the Current Medication list given to you today. ? ?*If you need a refill on your cardiac medications before your next appointment, please call your pharmacy* ? ?Lab Work: ?NONE ? ?Testing/Procedures: ?Your physician has requested you have a sleep study performed. ? ?Your physician has requested that you have an echocardiogram. Echocardiography is a painless test that uses sound waves to create images of your heart. It provides your doctor with information about the size and shape of your heart and how well your heart?s chambers and valves are working. This procedure takes approximately one hour. There are no restrictions for this procedure. ? ?Follow-Up: ?At [facility], you and your health needs are our priority.  As part of our continuing mission to provide you with exceptional heart care, we have created designated Provider Care Teams.  These Care Teams include your primary Cardiologist (physician) and Advanced Practice Providers (APPs -  Physician Assistants and Nurse Practitioners) who all work together to provide you with the care you need, when you need it. ? ?Your next appointment:   ?1 month(s) ? ?The format for your next appointment:   ?In Person ? ?Provider:   ?Lesleigh Noe, MD { ? ? ?Important Information About Sugar ? ? ? ? ?  ?

## 2022-03-07 ENCOUNTER — Telehealth: Payer: Self-pay | Admitting: *Deleted

## 2022-03-07 NOTE — Telephone Encounter (Signed)
Prior Authorization for HST sent to [facility] MEDICAID via web portal. Prior Authorization is not required for the requested services Decision ID #:[id number].

## 2022-03-21 ENCOUNTER — Ambulatory Visit (HOSPITAL_COMMUNITY): Payer: Medicaid Other | Attending: Cardiology

## 2022-04-09 ENCOUNTER — Ambulatory Visit (HOSPITAL_COMMUNITY): Payer: Medicaid Other | Attending: Cardiology

## 2022-04-09 DIAGNOSIS — I517 Cardiomegaly: Secondary | ICD-10-CM | POA: Diagnosis present

## 2022-04-09 LAB — ECHOCARDIOGRAM COMPLETE
Area-P 1/2: 3.65 cm2
S' Lateral: 3.1 cm

## 2022-04-14 NOTE — Progress Notes (Signed)
Cardiology Office Note:    Date:  04/17/2022   ID:  [name], DOB 05/01/1991, MRN [id number]  PCP:  Department, [facility]  Cardiologist:  Lesleigh Noe, MD   Referring MD: Department, [place]   Chief Complaint  Patient presents with   Follow-up    Cardiomegaly    History of Present Illness:    [name] is a 30 y.o. female with a hx of cardiomegaly, pregnancy related hypertension, who is being referred for chest pain.   She is having chest pain while eating.  Also having some shortness of breath.  Shortness of breath is on exertion.  She has gained tremendous amount of weight since March increasing from 218 to 248 pounds.  She denies orthopnea.  She does not sleep well.  She awakens short of breath.  Past Medical History:  Diagnosis Date   Cardiomegaly    Chest pain    Pregnancy induced hypertension     Past Surgical History:  Procedure Laterality Date   CESAREAN SECTION     CESAREAN SECTION N/A 10/17/2014   Procedure: REPEAT CESAREAN SECTION;  Surgeon: Kathreen Cosier, MD;  Location: [facility];  Service: Obstetrics;  Laterality: N/A;   CESAREAN SECTION N/A 03/17/2018   Procedure: REPEAT CESAREAN SECTION;  Surgeon: [name], MD;  Location: [facility];  Service: Obstetrics;  Laterality: N/A;  Heather RNFA    Current Medications: Current Meds  Medication Sig   doxycycline (VIBRAMYCIN) 100 MG capsule Take 1 capsule (100 mg total) by mouth 2 (two) times daily. One po bid x 7 days     Allergies:   Patient has no known allergies.   Social History   Socioeconomic History   Marital status: Single    Spouse name: Not on file   Number of children: Not on file   Years of education: Not on file   Highest education level: Not on file  Occupational History   Not on file  Tobacco Use   Smoking status: Never   Smokeless tobacco: Never  Substance and Sexual Activity   Alcohol use: Yes   Drug use: No   Sexual activity: Yes     Birth control/protection: Injection  Other Topics Concern   Not on file  Social History Narrative   Not on file   Social Determinants of Health   Financial Resource Strain: Not on file  Food Insecurity: Not on file  Transportation Needs: Not on file  Physical Activity: Not on file  Stress: Not on file  Social Connections: Not on file     Family History: The patient's family history includes Arthritis in her mother; Hypertension in her mother.  ROS:   Please see the history of present illness.    Difficulty sleeping all other systems reviewed and are negative.  EKGs/Labs/Other Studies Reviewed:    The following studies were reviewed today: ECHOCARDIOGRAM 03/2022 IMPRESSIONS   1. Left ventricular ejection fraction, by estimation, is 60 to 65%. The  left ventricle has normal function. The left ventricle has no regional  wall motion abnormalities. Left ventricular diastolic parameters were  normal.   2. Right ventricular systolic function is normal. The right ventricular  size is normal. Tricuspid regurgitation signal is inadequate for assessing  PA pressure.   3. The mitral valve is normal in structure. No evidence of mitral valve  regurgitation. No evidence of mitral stenosis.   4. The aortic valve is tricuspid. Aortic valve regurgitation is not  visualized. No aortic stenosis is present.   5. The inferior vena cava is normal in size with greater than 50%  respiratory variability, suggesting right atrial pressure of 3 mmHg.   Comparison(s): No prior Echocardiogram.   EKG:  EKG normal sinus rhythm, poor R wave progression V1 through V2.  Recent Labs: 01/01/2022: ALT 13 02/13/2022: BUN 9; Creatinine, Ser 0.90; Hemoglobin 9.3; Platelets 442; Potassium 4.1; Sodium 137  Recent Lipid Panel No results found for: "CHOL", "TRIG", "HDL", "CHOLHDL", "VLDL", "LDLCALC", "LDLDIRECT"  Physical Exam:    VS:  BP (!) 128/92   Pulse 82   Ht 5\' 1"  (1.549 m)   Wt 246 lb 12.8 oz  (111.9 kg)   SpO2 97%   BMI 46.63 kg/m     Wt Readings from Last 3 Encounters:  04/17/22 246 lb 12.8 oz (111.9 kg)  02/28/22 245 lb 9.6 oz (111.4 kg)  01/01/22 218 lb (98.9 kg)     GEN: Morbidly obese. No acute distress HEENT: Normal NECK: No JVD. LYMPHATICS: No lymphadenopathy CARDIAC: No murmur. RRR S4 gallop, or edema. VASCULAR:  Normal Pulses. No bruits. RESPIRATORY:  Clear to auscultation without rales, wheezing or rhonchi  ABDOMEN: Soft, non-tender, non-distended, No pulsatile mass, MUSCULOSKELETAL: No deformity  SKIN: Warm and dry NEUROLOGIC:  Alert and oriented x 3 PSYCHIATRIC:  Normal affect   ASSESSMENT:    1. Chest pain of uncertain etiology   2. Cardiomegaly   3. Nonspecific abnormal electrocardiogram (ECG) (EKG)   4. Snoring   5. Pregnancy induced hypertension, antepartum    PLAN:    In order of problems listed above:  Chest pain occurs with swallowing.  She also has some dysphagia.  Refer to gastroenterology. Not demonstrated on echocardiography. Unchanged Sleep study is still pending Reduce sodium in diet.  Increase physical activity to 30 minutes aerobic 5 out of 7days/week.   Subsequent follow-up will be dependent upon GI evaluation and sleep study.  I do not think there is a significant cardiac issue at this time.  If she has significant sleep apnea and esophageal dysmotility, she will not need follow-up with cardiology.  Medication Adjustments/Labs and Tests Ordered: Current medicines are reviewed at length with the patient today.  Concerns regarding medicines are outlined above.  No orders of the defined types were placed in this encounter.  No orders of the defined types were placed in this encounter.   There are no Patient Instructions on file for this visit.   Signed, 01/03/22, MD  04/17/2022 10:48 AM    [facility]

## 2022-04-17 ENCOUNTER — Encounter: Payer: Self-pay | Admitting: Interventional Cardiology

## 2022-04-17 ENCOUNTER — Ambulatory Visit (INDEPENDENT_AMBULATORY_CARE_PROVIDER_SITE_OTHER): Payer: Medicaid Other | Admitting: Interventional Cardiology

## 2022-04-17 VITALS — BP 128/92 | HR 82 | Ht 61.0 in | Wt 246.8 lb

## 2022-04-17 DIAGNOSIS — R0683 Snoring: Secondary | ICD-10-CM

## 2022-04-17 DIAGNOSIS — R079 Chest pain, unspecified: Secondary | ICD-10-CM

## 2022-04-17 DIAGNOSIS — I517 Cardiomegaly: Secondary | ICD-10-CM

## 2022-04-17 DIAGNOSIS — O139 Gestational [pregnancy-induced] hypertension without significant proteinuria, unspecified trimester: Secondary | ICD-10-CM

## 2022-04-17 DIAGNOSIS — R1310 Dysphagia, unspecified: Secondary | ICD-10-CM

## 2022-04-17 DIAGNOSIS — R9431 Abnormal electrocardiogram [ECG] [EKG]: Secondary | ICD-10-CM | POA: Diagnosis not present

## 2022-04-17 NOTE — Patient Instructions (Signed)
Medication Instructions:  Your physician recommends that you continue on your current medications as directed. Please refer to the Current Medication list given to you today.  *If you need a refill on your cardiac medications before your next appointment, please call your pharmacy*  Lab Work: NONE  Testing/Procedures: NONE  Follow-Up: At [facility], you and your health needs are our priority.  As part of our continuing mission to provide you with exceptional heart care, we have created designated Provider Care Teams.  These Care Teams include your primary Cardiologist (physician) and Advanced Practice Providers (APPs -  Physician Assistants and Nurse Practitioners) who all work together to provide you with the care you need, when you need it.  Your next appointment:   6-9 month(s) as needed  The format for your next appointment:   In Person  Provider:   Lesleigh Noe, MD {  Other Instructions Dr. Katrinka Blazing has recommended you get in 30 minutes of moderate-intensity aerobic activity 5 days a week.  Your physician has referred you to [facility] for evaluation of dysphagia. Their office will call you to schedule an appointment.  Important Information About Sugar

## 2022-05-05 ENCOUNTER — Ambulatory Visit (HOSPITAL_BASED_OUTPATIENT_CLINIC_OR_DEPARTMENT_OTHER): Payer: Medicaid Other | Admitting: Cardiovascular Disease

## 2022-05-05 DIAGNOSIS — I517 Cardiomegaly: Secondary | ICD-10-CM

## 2022-05-15 ENCOUNTER — Ambulatory Visit (HOSPITAL_BASED_OUTPATIENT_CLINIC_OR_DEPARTMENT_OTHER): Payer: Medicaid Other | Attending: Interventional Cardiology | Admitting: Cardiovascular Disease

## 2022-05-15 DIAGNOSIS — G4733 Obstructive sleep apnea (adult) (pediatric): Secondary | ICD-10-CM | POA: Insufficient documentation

## 2022-05-15 DIAGNOSIS — G4736 Sleep related hypoventilation in conditions classified elsewhere: Secondary | ICD-10-CM | POA: Insufficient documentation

## 2022-05-15 DIAGNOSIS — I517 Cardiomegaly: Secondary | ICD-10-CM | POA: Insufficient documentation

## 2022-05-19 ENCOUNTER — Encounter (HOSPITAL_BASED_OUTPATIENT_CLINIC_OR_DEPARTMENT_OTHER): Payer: Self-pay | Admitting: Cardiovascular Disease

## 2022-05-19 NOTE — Procedures (Signed)
     Patient Name: [name], Vonseggern Date: 05/15/2022 Gender: Female D.O.B: [birth year] Age (years): 31 Referring Provider: Verdis Prime Height (inches): 61 Interpreting Physician: Nicki Guadalajara MD, ABSM Weight (lbs): 248 RPSGT: [place] Sink BMI: 47 MRN: [id number] Neck Size: 14.50  CLINICAL INFORMATION Sleep Study Type: HST  Indication for sleep study: excessive daytime sleepiness, non-restorative sleep, snoring, hypertension, cardiomegaly  Epworth Sleepiness Score: 21  SLEEP STUDY TECHNIQUE A multi-channel overnight portable sleep study was performed. The channels recorded were: nasal airflow, thoracic respiratory movement, and oxygen saturation with a pulse oximetry. Snoring was also monitored.  MEDICATIONS doxycycline (VIBRAMYCIN) 100 MG capsule Patient self administered medications include: N/A.  SLEEP ARCHITECTURE Patient was studied for 299 minutes. The sleep efficiency was 100.0 % and the patient was supine for 0%. The arousal index was 0.0 per hour.  RESPIRATORY PARAMETERS The overall AHI was 122.4 per hour, with a central apnea index of 0 per hour.  The oxygen nadir was 44% during sleep.  CARDIAC DATA Mean heart rate during sleep was 91.3 bpm. Heart rate range was 56 - 116 bpm.  IMPRESSIONS - Severe obstructive sleep apnea occurred during this study (AHI 122.4/h). - Severe oxygen desaturation was noted during this study (Min O2 44%). Time spent < 89% was 189.5 minutes. - Patient snored for 41.1 minutes (13.8%) during the sleep.  DIAGNOSIS - Obstructive Sleep Apnea (G47.33) - Nocturnal Hypoxemia (G47.36)  RECOMMENDATIONS - In this patient with cardiovascular comorbidities and severe sleep apnea with marked nocturnal hypoxemia recommend an expeditious in-lab CPAP with possible BiPAP titration study. - Effort should be made to optimize nasal and oropharyngeal patency. - Avoid alcohol, sedatives and other CNS depressants that may worsen sleep  apnea and disrupt normal sleep architecture. - Sleep hygiene should be reviewed to assess factors that may improve sleep quality. - Weight management (BMI 47)and regular exercise should be initiated or continued. - Return to Sleep Center to discuss the results of this study   [Electronically signed] 05/19/2022 03:51 PM  Nicki Guadalajara MD, [facility], ABSM Diplomate, American Board of Sleep Medicine  NPI: [id number]  [place] [facility] PH: [phone]   FX: [phone] ACCREDITED BY THE AMERICAN ACADEMY OF SLEEP MEDICINE

## 2022-06-02 ENCOUNTER — Telehealth: Payer: Self-pay | Admitting: *Deleted

## 2022-06-02 ENCOUNTER — Other Ambulatory Visit: Payer: Self-pay | Admitting: Cardiovascular Disease

## 2022-06-02 DIAGNOSIS — G4733 Obstructive sleep apnea (adult) (pediatric): Secondary | ICD-10-CM

## 2022-06-02 DIAGNOSIS — G4736 Sleep related hypoventilation in conditions classified elsewhere: Secondary | ICD-10-CM

## 2022-06-02 NOTE — Telephone Encounter (Signed)
Patient notified of sleep study results and recommendations. She agrees to proceed with CPAP titration study pending [facility] approval. She has no questions.

## 2022-06-02 NOTE — Telephone Encounter (Signed)
-----   Message from Lennette Bihari, MD sent at 05/19/2022  3:57 PM EDT ----- [name], please notify patient of the results and schedule for expeditious in lab CPAP/BiPAP titration study.  Patient has very severe sleep apnea with AHI at 122.4/h.

## 2022-06-02 NOTE — Telephone Encounter (Signed)
Prior Authorization for CPAP titration sent to [facility] via web portal. Tracking Number [id number].

## 2022-06-03 ENCOUNTER — Telehealth: Payer: Self-pay | Admitting: *Deleted

## 2022-06-03 NOTE — Telephone Encounter (Signed)
Received approval from [facility] for CPAP titration. Auth # [id number].

## 2022-06-20 ENCOUNTER — Ambulatory Visit (HOSPITAL_BASED_OUTPATIENT_CLINIC_OR_DEPARTMENT_OTHER): Payer: Medicaid Other | Attending: Cardiovascular Disease | Admitting: Cardiovascular Disease

## 2022-06-20 ENCOUNTER — Other Ambulatory Visit: Payer: Self-pay

## 2022-06-20 DIAGNOSIS — G4736 Sleep related hypoventilation in conditions classified elsewhere: Secondary | ICD-10-CM | POA: Insufficient documentation

## 2022-06-20 DIAGNOSIS — G4733 Obstructive sleep apnea (adult) (pediatric): Secondary | ICD-10-CM | POA: Insufficient documentation

## 2022-07-02 ENCOUNTER — Encounter (HOSPITAL_BASED_OUTPATIENT_CLINIC_OR_DEPARTMENT_OTHER): Payer: Medicaid Other | Admitting: Cardiovascular Disease

## 2022-07-07 ENCOUNTER — Encounter (HOSPITAL_BASED_OUTPATIENT_CLINIC_OR_DEPARTMENT_OTHER): Payer: Self-pay | Admitting: Cardiovascular Disease

## 2022-07-07 NOTE — Procedures (Signed)
      Patient Name: [name], [name] Date: 06/20/2022 Gender: Female D.O.B: 21-May-1991 Age (years): 31 Referring Provider: Daneen Schick Height (inches): 61 Interpreting Physician: Shelva Majestic MD, ABSM Weight (lbs): 225 RPSGT: Jorge Ny BMI: 43 MRN: [id number] Neck Size: 14.50  CLINICAL INFORMATION The patient is referred for a BiPAP titration to treat sleep apnea.  Date of HST: 05/11/2022: AHI 122.4/h; O2 nadir 44% with 189.5 minutes < 89%.  SLEEP STUDY TECHNIQUE As per the AASM Manual for the Scoring of Sleep and Associated Events v2.3 (April 2016) with a hypopnea requiring 4% desaturations.  The channels recorded and monitored were frontal, central and occipital EEG, electrooculogram (EOG), submentalis EMG (chin), nasal and oral airflow, thoracic and abdominal wall motion, anterior tibialis EMG, snore microphone, electrocardiogram, and pulse oximetry. Bilevel positive airway pressure (BPAP) was initiated at the beginning of the study and titrated to treat sleep-disordered breathing.  MEDICATIONS doxycycline (VIBRAMYCIN) 100 MG capsule  Medications self-administered by patient taken the night of the study : N/A  RESPIRATORY PARAMETERS Optimal IPAP Pressure (cm):  AHI at Optimal Pressure (/hr) N/A Optimal EPAP Pressure (cm):   Overall Minimal O2 (%): 75.0 Minimal O2 at Optimal Pressure (%): 75.0  SLEEP ARCHITECTURE Start Time: 10:59:54 PM Stop Time: 5:11:20 AM Total Time (min): 371.4 Total Sleep Time (min): 349.8 Sleep Latency (min): 19.6 Sleep Efficiency (%): 94.2% REM Latency (min): 39.0 WASO (min): 2.0 Stage N1 (%): 0.6% Stage N2 (%): 55.5% Stage N3 (%): 12.2% Stage R (%): 31.7 Supine (%): 64.68 Arousal Index (/hr): 3.9   CARDIAC DATA The 2 lead EKG demonstrated sinus rhythm. The mean heart rate was 78.3 beats per minute. Other EKG findings include: None.  LEG MOVEMENT DATA The total Periodic Limb Movements of Sleep (PLMS) were 0. The PLMS index was  0.0. A PLMS index of <15 is considered normal in adults.  IMPRESSIONS - CPAP was initiated at 5 cm and was titrated to 20 cm, transitioned to BiPAP and titrated to maximal pressure at 26/22 with AHI 4.5, O2 nadir 90% without REM sleep at this pressure. - Severe oxygen desaturations to a nadir of 75% at 9 cm of water.  - The patient snored with loud snoring volume. - No cardiac abnormalities were observed during this study. - Clinically significant periodic limb movements were not noted during this study. Arousals associated with PLMs were rare.  DIAGNOSIS - Obstructive Sleep Apnea (G47.33) - Nocturnal Hypoxemia (G47.36)  RECOMMENDATIONS - Recommend an initial trial of BiPAP Auto with EPAP min of 16, PS of 4 and IPAP max of 25 cm of water.   - Effort should be made to optimize nasal and oropharyngeal patency. - Avoid alcohol, sedatives and other CNS depressants that may worsen sleep apnea and disrupt normal sleep architecture. - Sleep hygiene should be reviewed to assess factors that may improve sleep quality. - Weight management (BMI 43) and regular exercise should be initiated or continued. - Recommend a download and sleep clinic evaluation after 4 weeks of therapy.   [Electronically signed] 07/07/2022 02:36 PM  Shelva Majestic MD, [facility], [place], American Board of Sleep Medicine  NPI: [id number]   [place] PH: [phone]   FX: [phone] [place]

## 2022-07-08 ENCOUNTER — Telehealth: Payer: Self-pay | Admitting: *Deleted

## 2022-07-08 NOTE — Telephone Encounter (Signed)
Patient notified via Mychart BIPAP machine ordered.

## 2022-07-08 NOTE — Telephone Encounter (Signed)
-----   Message from Judy Sine, MD sent at 07/07/2022  2:41 PM EDT ----- Mariann Laster, please notify pt and set up with DME for expeditious BIPAP initiation

## 2022-08-05 ENCOUNTER — Ambulatory Visit (HOSPITAL_COMMUNITY)
Admission: EM | Admit: 2022-08-05 | Discharge: 2022-08-05 | Disposition: A | Discharge status: Home / Self Care | Payer: Medicaid Other | Attending: Family Medicine | Admitting: Family Medicine

## 2022-08-05 ENCOUNTER — Encounter: Payer: Self-pay | Admitting: Physician Assistant

## 2022-08-05 ENCOUNTER — Encounter (HOSPITAL_COMMUNITY): Payer: Self-pay | Admitting: Emergency Medicine

## 2022-08-05 DIAGNOSIS — L905 Scar conditions and fibrosis of skin: Secondary | ICD-10-CM

## 2022-08-05 DIAGNOSIS — Z98891 History of uterine scar from previous surgery: Secondary | ICD-10-CM | POA: Diagnosis not present

## 2022-08-05 NOTE — ED Triage Notes (Signed)
Pt reports lower abdominal pain. States the pain is located on her scar from a c-section 4 years ago. States the same area has been painful off and on after the c-section but today is the first time in 6 months. Took ibuprofen for relief.

## 2022-08-05 NOTE — Discharge Instructions (Signed)
I recommend trying tylenol or ibuprofen for any pain.  I would follow up with your ob/gyn if symptoms persist

## 2022-08-05 NOTE — ED Provider Notes (Signed)
[place]    CSN: [id number] Arrival date & time: 08/05/22  1352      History   Chief Complaint Chief Complaint  Patient presents with   Abdominal Pain    HPI [name] is a 31 y.o. female. Presents with pain over c-section scar 3 c-sections, last one in 2019 Reports pain on and off since then.  Today its 8/10 discomfort. Tried ibuprofen without relief  Denies urinary symptoms. Last BM today.  LMP ended two days ago Denies vaginal discharge  No other symptoms apart from discomfort on the scar  Past Medical History:  Diagnosis Date   Cardiomegaly    Chest pain    Pregnancy induced hypertension     Patient Active Problem List   Diagnosis Date Noted   OSA (obstructive sleep apnea) 06/20/2022   Cesarean delivery, delivered, current hospitalization 03/17/2018   S/P cesarean section 10/17/2014    Past Surgical History:  Procedure Laterality Date   CESAREAN SECTION     CESAREAN SECTION N/A 10/17/2014   Procedure: REPEAT CESAREAN SECTION;  Surgeon: Frederico Hamman, MD;  Location: [facility];  Service: Obstetrics;  Laterality: N/A;   CESAREAN SECTION N/A 03/17/2018   Procedure: REPEAT CESAREAN SECTION;  Surgeon: Vanessa Kick, MD;  Location: [facility];  Service: Obstetrics;  Laterality: N/A;  Heather RNFA    OB History     Gravida  3   Para  3   Term  3   Preterm      AB      Living  3      SAB      IAB      Ectopic      Multiple  0   Live Births  3            Home Medications    Prior to Admission medications   Not on File    Family History Family History  Problem Relation Age of Onset   Arthritis Mother    Hypertension Mother     Social History Social History   Tobacco Use   Smoking status: Never   Smokeless tobacco: Never  Substance Use Topics   Alcohol use: Yes   Drug use: No     Allergies   Patient has no known allergies.   Review of Systems Review of Systems  Gastrointestinal:   Positive for abdominal pain.  Per HPI   Physical Exam Triage Vital Signs ED Triage Vitals  Enc Vitals Group     BP 08/05/22 1449 (!) 138/96     Pulse Rate 08/05/22 1449 97     Resp 08/05/22 1449 18     Temp 08/05/22 1449 98.6 F (37 C)     Temp Source 08/05/22 1449 Oral     SpO2 08/05/22 1449 100 %     Weight --      Height --      Head Circumference --      Peak Flow --      Pain Score 08/05/22 1448 8     Pain Loc --      Pain Edu? --      Excl. in [place]? --    No data found.  Updated Vital Signs BP (!) 138/96 (BP Location: Right Arm)   Pulse 97   Temp 98.6 F (37 C) (Oral)   Resp 18   SpO2 100%    Physical Exam Vitals and nursing note reviewed.  Constitutional:  Appearance: Normal appearance.  HENT:     Mouth/Throat:     Mouth: Mucous membranes are moist.     Pharynx: Oropharynx is clear.  Eyes:     Conjunctiva/sclera: Conjunctivae normal.  Cardiovascular:     Rate and Rhythm: Normal rate and regular rhythm.     Heart sounds: Normal heart sounds.  Pulmonary:     Effort: Pulmonary effort is normal. No respiratory distress.     Breath sounds: Normal breath sounds.  Abdominal:     General: Bowel sounds are normal.     Palpations: Abdomen is soft.     Tenderness: There is no abdominal tenderness. There is no right CVA tenderness, left CVA tenderness, guarding or rebound.     Comments: Feels tingling when transverse scar is lightly pressed  Musculoskeletal:        General: Normal range of motion.  Skin:    General: Skin is warm and dry.  Neurological:     Mental Status: She is alert and oriented to person, place, and time.      UC Treatments / Results  Labs (all labs ordered are listed, but only abnormal results are displayed) Labs Reviewed - No data to display  EKG   Radiology No results found.  Procedures Procedures (including critical care time)  Medications Ordered in UC Medications - No data to display  Initial Impression /  Assessment and Plan / UC Course  I have reviewed the triage vital signs and the nursing notes.  Pertinent labs & imaging results that were available during my care of the patient were reviewed by me and considered in my medical decision making (see chart for details).  Benign abdominal exam No other symptoms apart from over the scar  Discussed muscular/nerve sensation can be normal given the layers of body that are cut through, especially after 3 c-sections in the same place Discussed ibuprofen or tylenol to try Recommend follow up with ob/gyn if needed ED if she develops severe abd pain Work note provided Patient agrees to plan  Final Clinical Impressions(s) / UC Diagnoses   Final diagnoses:  Scar of abdominal wall  History of 3 cesarean sections     Discharge Instructions      I recommend trying tylenol or ibuprofen for any pain.  I would follow up with your ob/gyn if symptoms persist    ED Prescriptions   None    PDMP not reviewed this encounter.   Tillmon Kisling, [place] 08/05/22 1734

## 2022-08-12 ENCOUNTER — Telehealth: Payer: Self-pay | Admitting: Interventional Cardiology

## 2022-08-12 NOTE — Telephone Encounter (Signed)
Patient has forms she needs to have completed for work. She states she is going to drop them off at the office.  She stated her job had faxed them to [place] as well, not sure if we received them.

## 2022-08-15 NOTE — Telephone Encounter (Signed)
Returned call to patient.  Informed her no paperwork has been received. Patient states today is the due date for FMLA forms. Informed patient Dr. Tamala Julian it out of the office until 08/25/22, and we have not received any forms to be completed.  Advised patient to speak with her employer and see if she can request an extension. Provided office fax number [phone] Attn: Dr. Tamala Julian.  Patient verbalized understanding.

## 2022-09-01 ENCOUNTER — Encounter: Payer: Self-pay | Admitting: *Deleted

## 2022-09-05 NOTE — Progress Notes (Signed)
09/08/2022 [name] [id number] Sep 09, 1991  Referring provider: Department, [facility]* Primary GI doctor: Dr. Tomasa Rand  ASSESSMENT AND PLAN:   Esophageal dysphagia? Versus regurgitation  Very poor historian, very low medical literacy Sounds more like regurg when she has more increased intraAB pressure like bending over. No GERD Possible thyroid goiter on exam, will get AB [place] Start with barium swallow as patient does not want to purse EGD at this time, if this shows stricture or GERD or symptoms not improving, consider EGD Lifestyle changes discussed, avoid NSAIDS, ETOH Instructed to start the PPI AFTER stool sample for H pylori  Goiter on exam Get thyroid US, check TSH  Morbid obesity (HCC) Body mass index is 47.68 kg/m.  -Patient has been advised to make an attempt to improve diet and exercise patterns to aid in weight loss. -Recommended diet heavy in fruits and veggies and low in animal meats, cheeses, and dairy products, appropriate calorie intake - likely contributing to GERD/symptoms  OSA  Not on Bipap Abnormal breathing actively during exam, very noisy  Patient Care Team: Department, [facility] as PCP - General Katrinka Blazing Barry Dienes, MD as PCP - Cardiology (Cardiology)  HISTORY OF PRESENT ILLNESS: 32 y.o. female with a past medical history of cardiomegaly, OSA not on BiPAP and others listed below presents for evaluation of dysphagia.  History of 3 C-sections, last was 11/08/2017. 01/01/2022 CT abdomen pelvis with contrast for abdominal pain mildly large prominent left ovary and small amount of pelvic free fluid consider pelvic ultrasound concern for mild pelvic inflammatory disease tubo-ovarian abscess, normal appendix, normal gallbladder and no obstructive uropathy left colon diverticulosis without diverticulitis.  Patient very poor historian.  She states she has had feeling that food comes back up with coughing but denies GERD.   She feels that  if she breaths heavy and has SOB at time.  She has had dysphagia in the past with food, but states has been better lately.  Has had AB pain in the past, intermittently once a month, not associated with menses, will have pain at Csection site, worse with cold weather.  Can last for 30 mins to an hour. Better with balling up into fetal position.  No associated symptoms with it.  She has BM daily, can be hard at time, no AB pain associated with it.  Denies hematochezia or melena.  She denies nausea, vomiting.  She  denies AB bloating.  No unintentional weight loss, no night sweats. She reports nocturnal symptoms.   She reports NSAID use. Will take ibuprofen or tylenol with the pain only, not often.  She denies ETOH use.   She denies family history of gallbladder issues.    Wt Readings from Last 3 Encounters:  09/08/22 244 lb 2 oz (110.7 kg)  06/20/22 225 lb (102.1 kg)  05/15/22 248 lb (112.5 kg)    She  reports that she has never smoked. She has never used smokeless tobacco. She reports that she does not currently use alcohol. She reports that she does not use drugs.  Current Medications:        Current Outpatient Medications (Other):    pantoprazole (PROTONIX) 40 MG tablet, Take 1 tablet (40 mg total) by mouth daily. Can start AFTER stool sample  Medical History:  Past Medical History:  Diagnosis Date   Cardiomegaly    Chest pain    Diverticulosis    Pregnancy induced hypertension    Allergies: No Known Allergies   Surgical History:  She  has a past surgical history that includes Cesarean section; Cesarean section (N/A, 10/17/2014); and Cesarean section (N/A, 03/17/2018). Family History:  Her family history includes Arthritis in her mother; Hypertension in her mother.  REVIEW OF SYSTEMS  : All other systems reviewed and negative except where noted in the History of Present Illness.  PHYSICAL EXAM: BP 108/70   Pulse 92   Ht 5' (1.524 m)   Wt 244 lb 2 oz (110.7 kg)    BMI 47.68 kg/m  General:   Pleasant, well developed female in no acute distress Head:   Normocephalic and atraumatic. Thyroid endlargement/goiter Eyes:  sclerae anicteric,conjunctive pink  Heart:   regular rate and rhythm, loud s2 Pulm:  Clear anteriorly; no wheezing Abdomen:   Soft, Obese AB, Active bowel sounds. No tenderness . , No organomegaly appreciated. Rectal: Not evaluated Extremities:  Without edema. Msk: Symmetrical without gross deformities. Peripheral pulses intact.  Neurologic:  Alert and  oriented x4;  No focal deficits.  Skin:   Dry and intact without significant lesions or rashes. Psychiatric:  Cooperative. Normal mood and affect.    Doree Albee, PA-C 3:35 PM

## 2022-09-08 ENCOUNTER — Encounter: Payer: Self-pay | Admitting: Physician Assistant

## 2022-09-08 ENCOUNTER — Other Ambulatory Visit (INDEPENDENT_AMBULATORY_CARE_PROVIDER_SITE_OTHER): Payer: Medicaid Other

## 2022-09-08 ENCOUNTER — Ambulatory Visit (INDEPENDENT_AMBULATORY_CARE_PROVIDER_SITE_OTHER): Payer: Medicaid Other | Admitting: Physician Assistant

## 2022-09-08 VITALS — BP 108/70 | HR 92 | Ht 60.0 in | Wt 244.1 lb

## 2022-09-08 DIAGNOSIS — R1110 Vomiting, unspecified: Secondary | ICD-10-CM | POA: Diagnosis not present

## 2022-09-08 DIAGNOSIS — G4733 Obstructive sleep apnea (adult) (pediatric): Secondary | ICD-10-CM

## 2022-09-08 DIAGNOSIS — E049 Nontoxic goiter, unspecified: Secondary | ICD-10-CM

## 2022-09-08 DIAGNOSIS — R1319 Other dysphagia: Secondary | ICD-10-CM

## 2022-09-08 LAB — COMPREHENSIVE METABOLIC PANEL
ALT: 7 U/L (ref 0–35)
AST: 16 U/L (ref 0–37)
Albumin: 4.2 g/dL (ref 3.5–5.2)
Alkaline Phosphatase: 64 U/L (ref 39–117)
BUN: 9 mg/dL (ref 6–23)
CO2: 28 mEq/L (ref 19–32)
Calcium: 8.9 mg/dL (ref 8.4–10.5)
Chloride: 102 mEq/L (ref 96–112)
Creatinine, Ser: 0.82 mg/dL (ref 0.40–1.20)
GFR: 93.88 mL/min (ref >60.00)
Glucose, Bld: 116 mg/dL — ABNORMAL HIGH (ref 70–99)
Potassium: 3.6 mEq/L (ref 3.5–5.1)
Sodium: 138 mEq/L (ref 135–145)
Total Bilirubin: 0.3 mg/dL (ref 0.2–1.2)
Total Protein: 8 g/dL (ref 6.0–8.3)

## 2022-09-08 LAB — TSH: TSH: 1.04 u[IU]/mL (ref 0.35–5.50)

## 2022-09-08 LAB — CBC WITH DIFFERENTIAL/PLATELET
Basophils Absolute: 0 10*3/uL (ref 0.0–0.1)
Basophils Relative: 0.8 % (ref 0.0–3.0)
Eosinophils Absolute: 0.2 10*3/uL (ref 0.0–0.7)
Eosinophils Relative: 2.6 % (ref 0.0–5.0)
HCT: 30.3 % — ABNORMAL LOW (ref 36.0–46.0)
Hemoglobin: 9.4 g/dL — ABNORMAL LOW (ref 12.0–15.0)
Lymphocytes Relative: 31.9 % (ref 12.0–46.0)
Lymphs Abs: 2 10*3/uL (ref 0.7–4.0)
MCHC: 30.9 g/dL (ref 30.0–36.0)
MCV: 69.4 fl — ABNORMAL LOW (ref 78.0–100.0)
Monocytes Absolute: 0.5 10*3/uL (ref 0.1–1.0)
Monocytes Relative: 7.8 % (ref 3.0–12.0)
Neutro Abs: 3.6 10*3/uL (ref 1.4–7.7)
Neutrophils Relative %: 56.9 % (ref 43.0–77.0)
Platelets: 465 10*3/uL — ABNORMAL HIGH (ref 150.0–400.0)
RBC: 4.36 Mil/uL (ref 3.87–5.11)
RDW: 20.3 % — ABNORMAL HIGH (ref 11.5–15.5)
WBC: 6.3 10*3/uL (ref 4.0–10.5)

## 2022-09-08 MED ORDER — PANTOPRAZOLE SODIUM 40 MG PO TBEC: 40.0000 mg | DELAYED_RELEASE_TABLET | Freq: Every day | ORAL | 3 refills | Status: DC

## 2022-09-08 NOTE — Patient Instructions (Signed)
You have been scheduled for a Barium Esophogram and Thyroid ultrasound at [facility] (1st floor of the hospital) on 09/16/22 at 11:00 am. Please arrive 30 minutes prior to your appointment for registration. Make certain not to have anything to eat or drink 3 hours prior to your test. If you need to reschedule for any reason, please contact radiology at [phone] to do so. __________________________________________________________________ A barium swallow is an examination that concentrates on views of the esophagus. This tends to be a double contrast exam (barium and two liquids which, when combined, create a gas to distend the wall of the oesophagus) or single contrast (non-ionic iodine based). The study is usually tailored to your symptoms so a good history is essential. Attention is paid during the study to the form, structure and configuration of the esophagus, looking for functional disorders (such as aspiration, dysphagia, achalasia, motility and reflux) EXAMINATION You may be asked to change into a gown, depending on the type of swallow being performed. A radiologist and radiographer will perform the procedure. The radiologist will advise you of the type of contrast selected for your procedure and direct you during the exam. You will be asked to stand, sit or lie in several different positions and to hold a small amount of fluid in your mouth before being asked to swallow while the imaging is performed .In some instances you may be asked to swallow barium coated marshmallows to assess the motility of a solid food bolus. The exam can be recorded as a digital or video fluoroscopy procedure. POST PROCEDURE It will take 1-2 days for the barium to pass through your system. To facilitate this, it is important, unless otherwise directed, to increase your fluids for the next 24-48hrs and to resume your normal diet.  This test typically takes about 30 minutes to  perform. __________________________________________________________________________________  Please get the machicine for the CPAP/BiPAP and use it.   Your provider has requested that you go to the basement level for lab work before leaving today. Press "B" on the elevator. The lab is located at the first door on the left as you exit the elevator.  Please take your proton pump inhibitor medication, protonix once daily START AFTER THE STOOL SAMPLES   Please take this medication 30 minutes to 1 hour before meals- this makes it more effective.  Avoid spicy and acidic foods Avoid fatty foods Limit your intake of coffee, tea, alcohol, and carbonated drinks Work to maintain a healthy weight Keep the head of the bed elevated at least 3 inches with blocks or a wedge pillow if you are having any nighttime symptoms Stay upright for 2 hours after eating Avoid meals and snacks three to four hours before bedtime  Silent reflux: Not all heartburn burns...Marland KitchenMarland KitchenMarland Kitchen  What is LPR? Laryngopharyngeal reflux (LPR) or silent reflux is a condition in which acid that is made in the stomach travels up the esophagus (swallowing tube) and gets to the throat. Not everyone with reflux has a lot of heartburn or indigestion. In fact, many people with LPR never have heartburn. This is why LPR is called SILENT REFLUX, and the terms "Silent reflux" and "LPR" are often used interchangeably. Because LPR is silent, it is sometimes difficult to diagnose.  How can you tell if you have LPR?  Chronic hoarseness- Some people have hoarseness that comes and goes throat clearing  Cough It can cause shortness of breath and cause asthma like symptoms. a feeling of a lump in the throat  difficulty swallowing a problem with too much nose and throat drainage.  Some people will feel their esophagus spasm which feels like their heart beating hard and fast, this will usually be after a meal, at rest, or lying down at night.    How do I  treat this? Treatment for LPR should be individualized, and your doctor will suggest the best treatment for you. Generally there are several treatments for LPR: changing habits and diet to reduce reflux,  medications to reduce stomach acid, and  surgery to prevent reflux. Most people with LPR need to modify how and when they eat, as well as take some medication, to get well. Sometimes, nonprescription liquid antacids, such as Maalox, Gelucil and Mylanta are recommended. When used, these antacids should be taken four times each day - one tablespoon one hour after each meal and before bedtime. Dietary and lifestyle changes alone are not often enough to control LPR - medications that reduce stomach acid are also usually needed. These must be prescribed by our doctor.   TIPS FOR REDUCING REFLUX AND LPR Control your LIFE-STYLE and your DIET! If you use tobacco, QUIT.  Smoking makes you reflux. After every cigarette you have some LPR.  Don't wear clothing that is too tight, especially around the waist (trousers, corsets, belts).  Do not lie down just after eating...in fact, do not eat within three hours of bedtime.  You should be on a low-fat diet.  Limit your intake of red meat.  Limit your intake of butter.  Avoid fried foods.  Avoid chocolate  Avoid cheese.  Avoid eggs. Specifically avoid caffeine (especially coffee and tea), soda pop (especially cola) and mints.  Avoid alcoholic beverages, particularly in the evening.

## 2022-09-09 ENCOUNTER — Other Ambulatory Visit (INDEPENDENT_AMBULATORY_CARE_PROVIDER_SITE_OTHER): Payer: Medicaid Other

## 2022-09-09 ENCOUNTER — Other Ambulatory Visit: Payer: Self-pay

## 2022-09-09 DIAGNOSIS — D509 Iron deficiency anemia, unspecified: Secondary | ICD-10-CM

## 2022-09-09 LAB — IBC + FERRITIN
Ferritin: 3.7 ng/mL — ABNORMAL LOW (ref 10.0–291.0)
Iron: 21 ug/dL — ABNORMAL LOW (ref 42–145)
Saturation Ratios: 3.9 % — ABNORMAL LOW (ref 20.0–50.0)
TIBC: 541.8 ug/dL — ABNORMAL HIGH (ref 250.0–450.0)
Transferrin: 387 mg/dL — ABNORMAL HIGH (ref 212.0–360.0)

## 2022-09-09 LAB — VITAMIN B12: Vitamin B-12: 694 pg/mL (ref 211–911)

## 2022-09-16 ENCOUNTER — Ambulatory Visit (HOSPITAL_COMMUNITY): Admission: RE | Admit: 2022-09-16 | Payer: Medicaid Other | Source: Ambulatory Visit

## 2022-09-16 ENCOUNTER — Other Ambulatory Visit (HOSPITAL_COMMUNITY): Payer: Medicaid Other

## 2022-09-23 ENCOUNTER — Ambulatory Visit (HOSPITAL_COMMUNITY): Admission: RE | Admit: 2022-09-23 | Payer: Medicaid Other | Source: Ambulatory Visit

## 2022-11-18 IMAGING — CR DG RIBS W/ CHEST 3+V*L*
4 series · 4 of 4 positions shown · non-contrast
Comparison: 01/01/2022

CLINICAL DATA: Mid axillary rib pain

EXAM:
LEFT RIBS AND CHEST - 3+ VIEW

[chest pa]
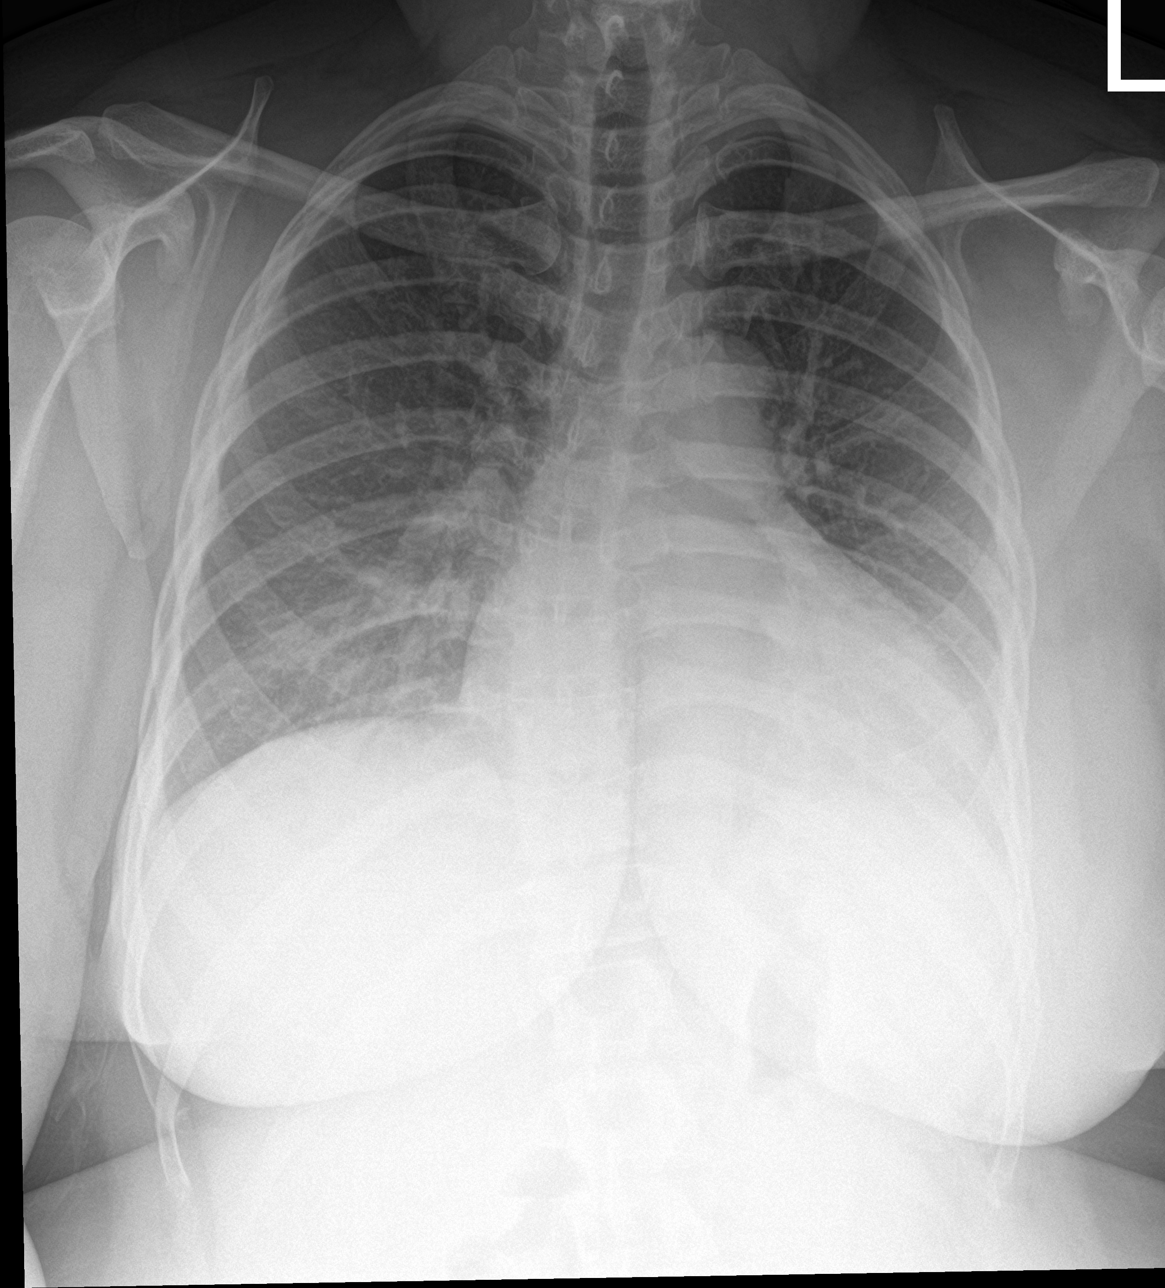

[rib ap]
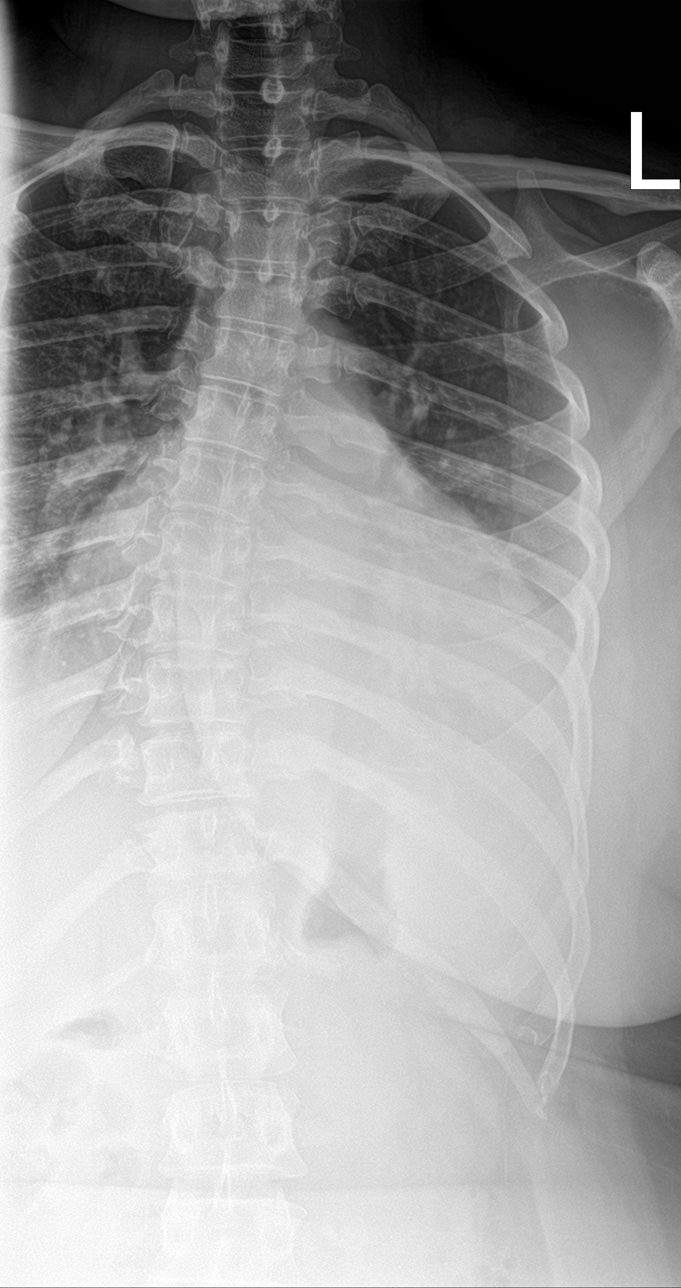

[rib ap obl (1 of 2)]
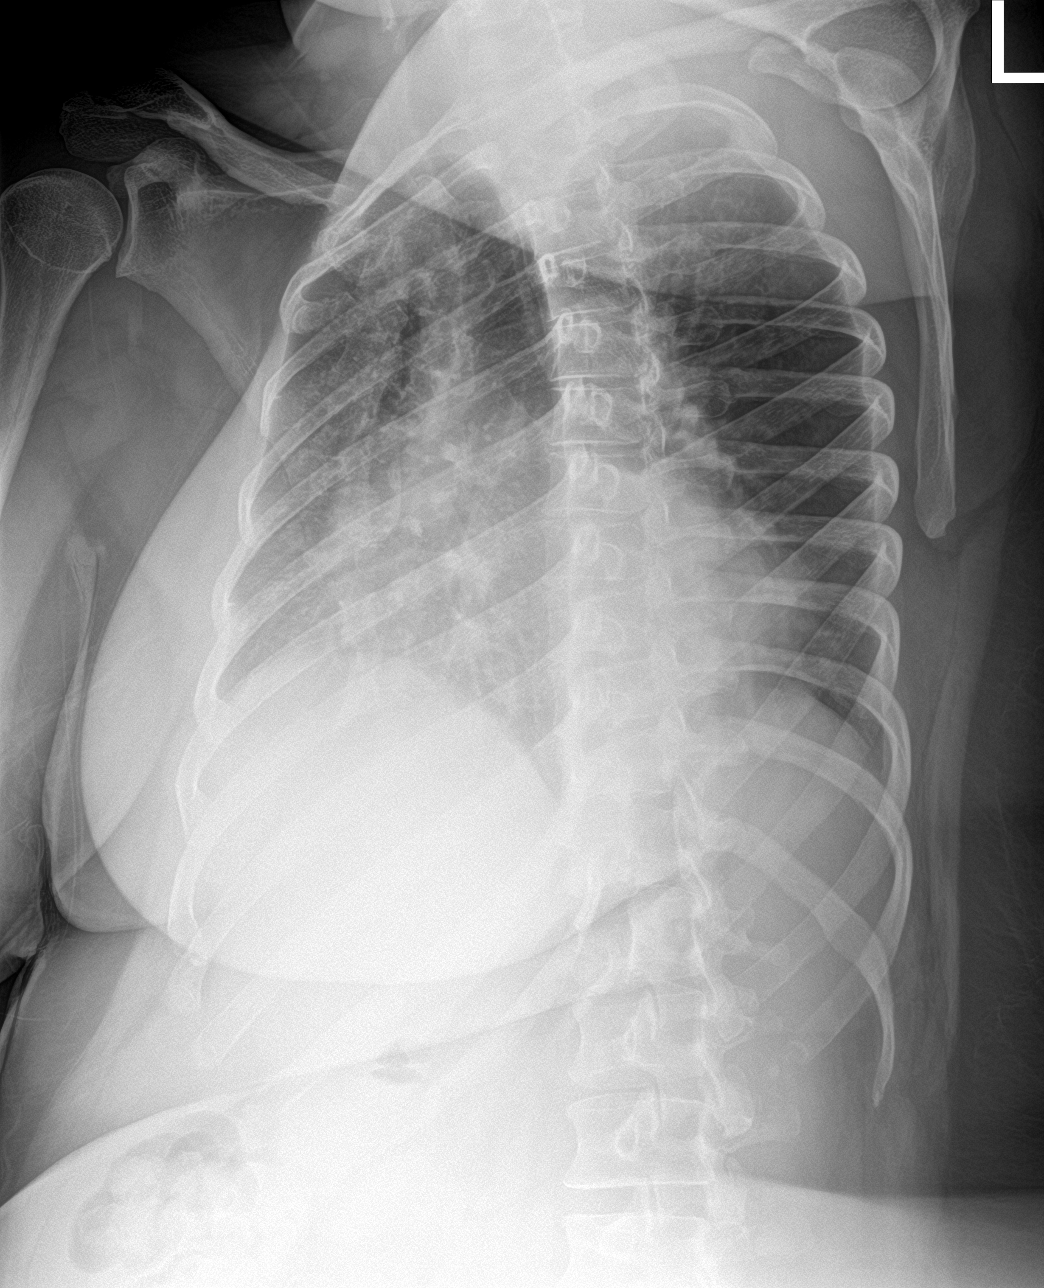

[rib ap obl (2 of 2)]
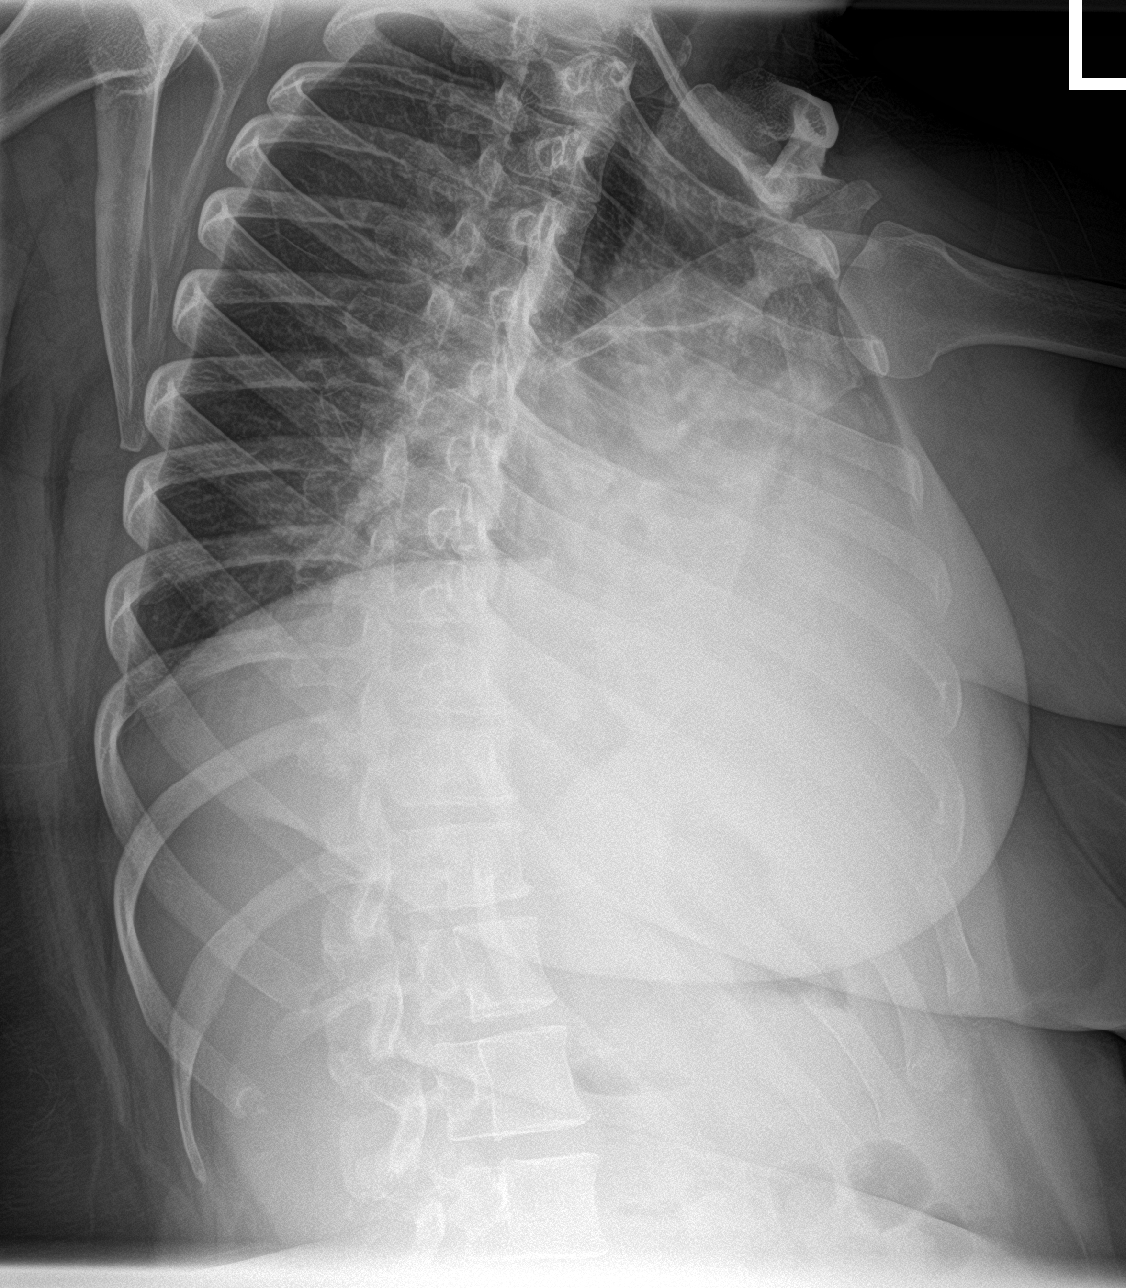

[4 of 4 positions shown; findings below may reference images not displayed]

FINDINGS: Cardiomegaly, vascular congestion. No confluent opacities, effusions
or overt edema. No acute bony abnormality. No visible rib fracture.
No pneumothorax.
IMPRESSION: Cardiomegaly, vascular congestion.

No visible rib fracture.

## 2022-11-18 IMAGING — CT CT ANGIO CHEST
2 of 6 series · 18 of 46 positions shown · IV contrast (APPLIED)
Comparison: Chest radiograph 02/13/2022

CLINICAL DATA: Left-sided rib pain starting yesterday. Shortness of
breath

EXAM:
CT ANGIOGRAPHY CHEST WITH CONTRAST
TECHNIQUE: Multidetector CT imaging of the chest was performed using the
standard protocol during bolus administration of intravenous
contrast. Multiplanar CT image reconstructions and MIPs were
obtained to evaluate the vascular anatomy.

[Series 6: thins · axial · 0.78mm/px · z∈[+1066,+1300]mm · 15 of 258 slices shown]
[im 12/258  lung]
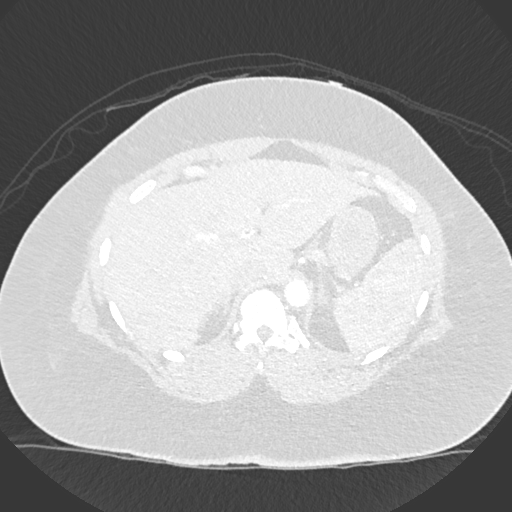
[im 34/258  soft-tissue]
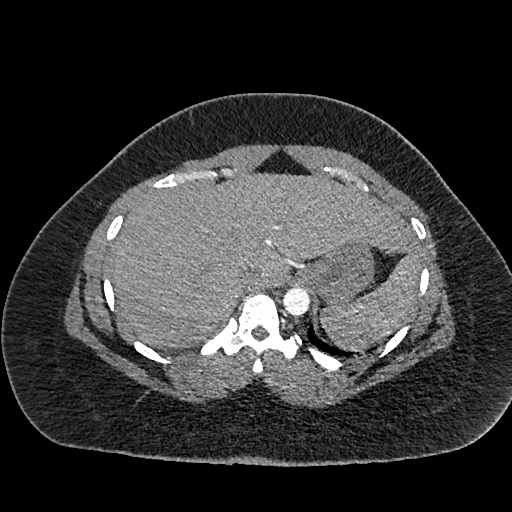
[im 45/258  lung]
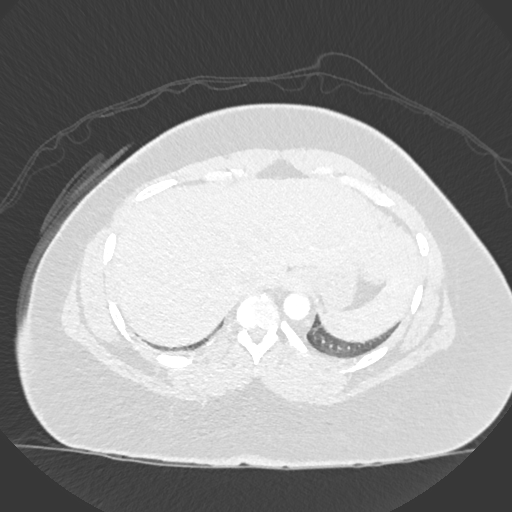
[im 68/258  soft-tissue]
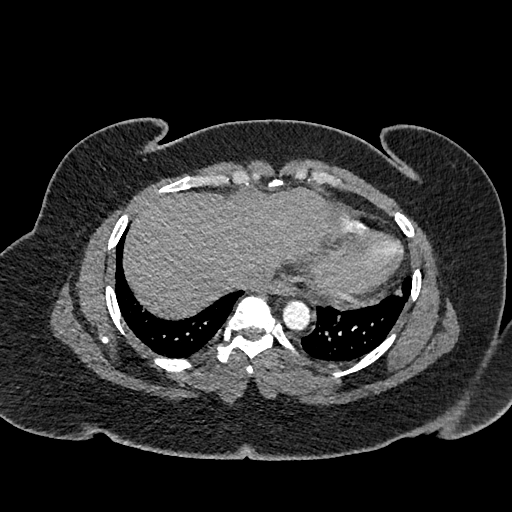
[im 79/258  lung]
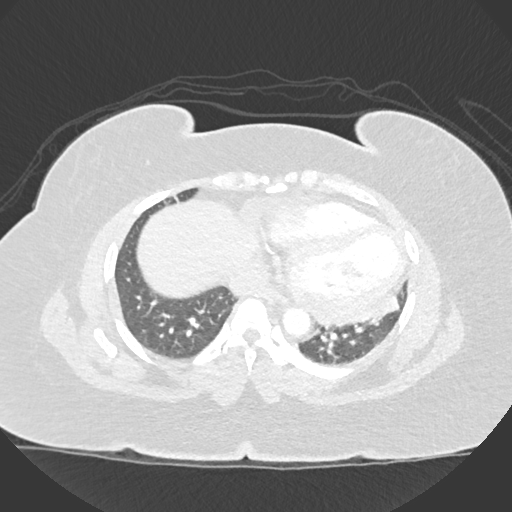
[im 101/258  soft-tissue]
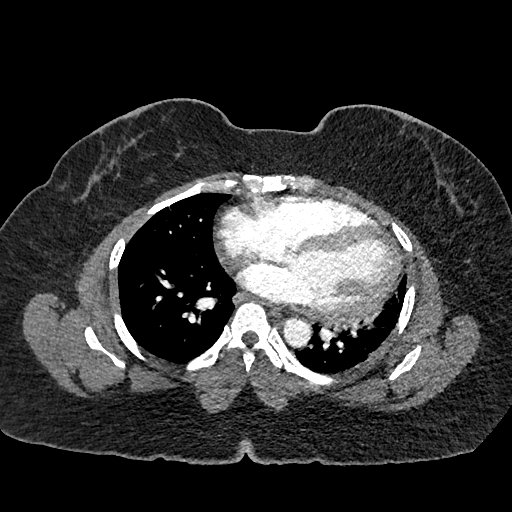
[im 112/258  lung]
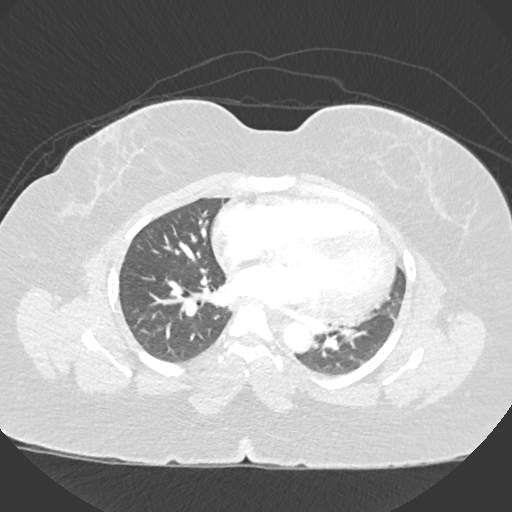
[im 135/258  soft-tissue]
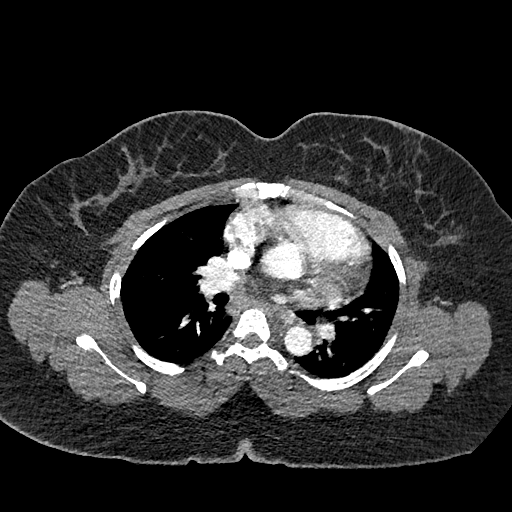
[im 146/258  lung]
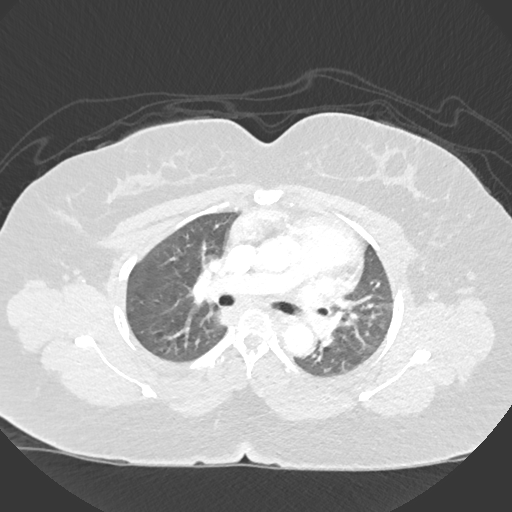
[im 157/258  soft-tissue]
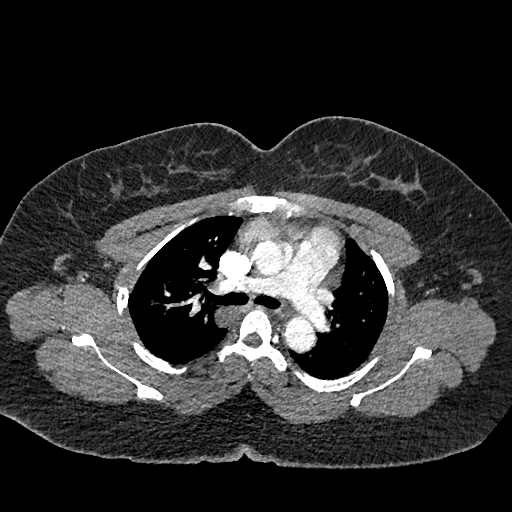
[im 179/258  lung]
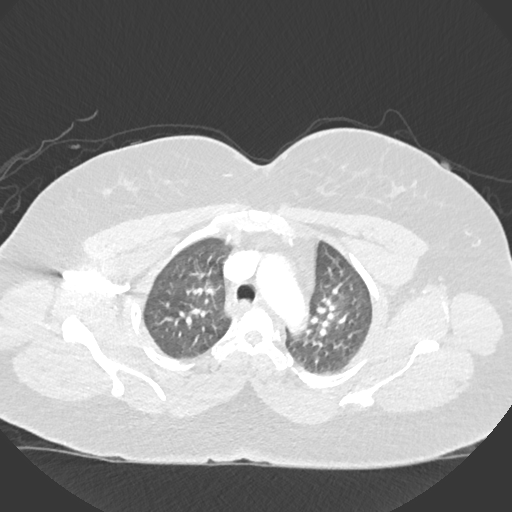
[im 190/258  soft-tissue]
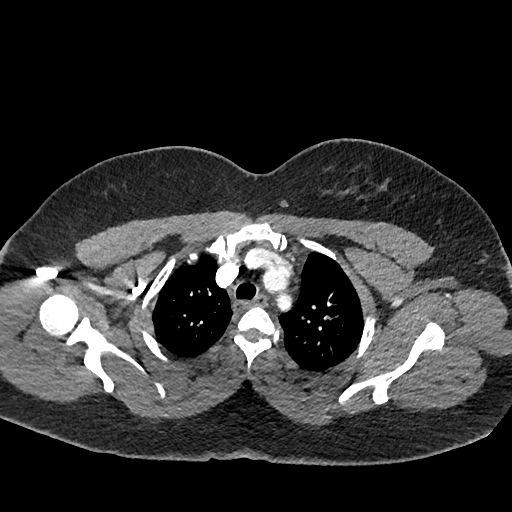
[im 213/258  lung]
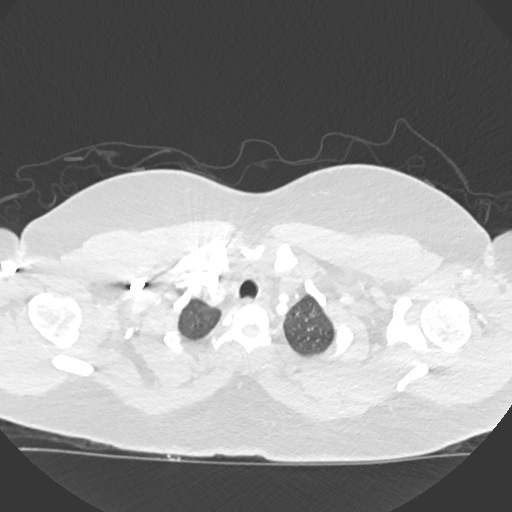
[im 224/258  soft-tissue]
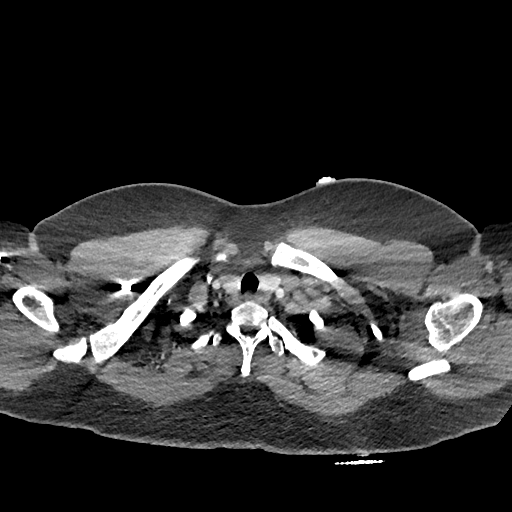
[im 246/258  lung]
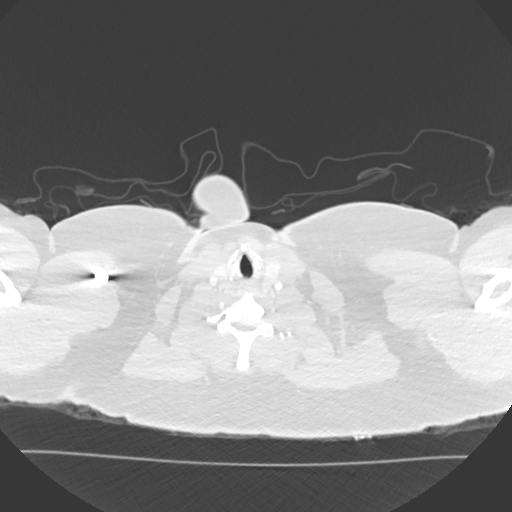

[Series 8: coronal mpr · coronal · 0.55mm/px · 3 of 151 slices shown]
[im 38/151  soft-tissue]
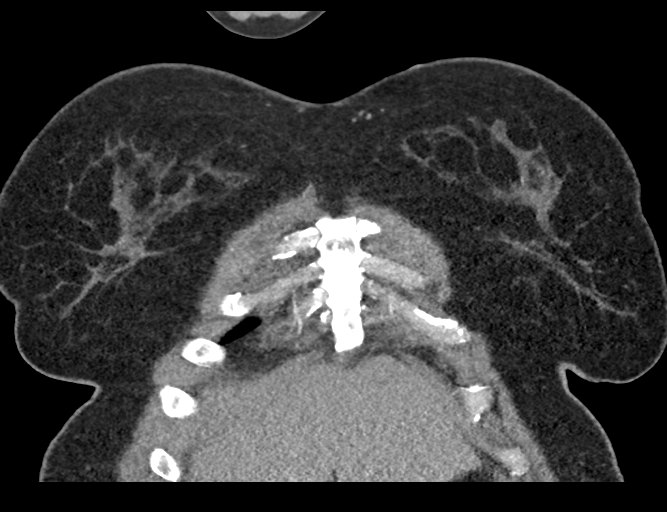
[im 76/151  soft-tissue]
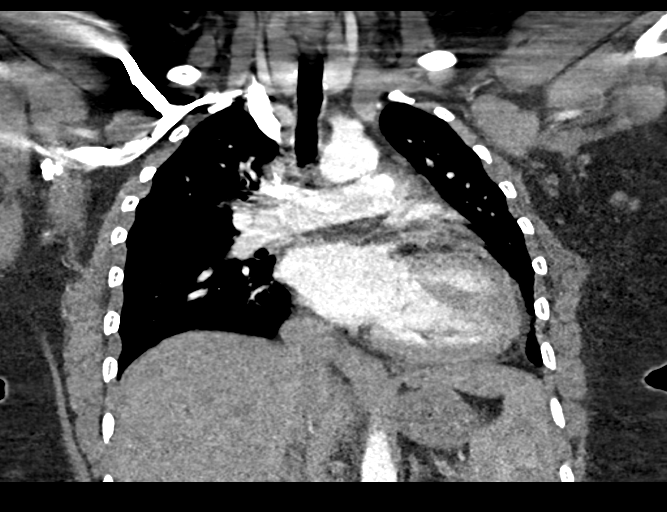
[im 113/151  soft-tissue]
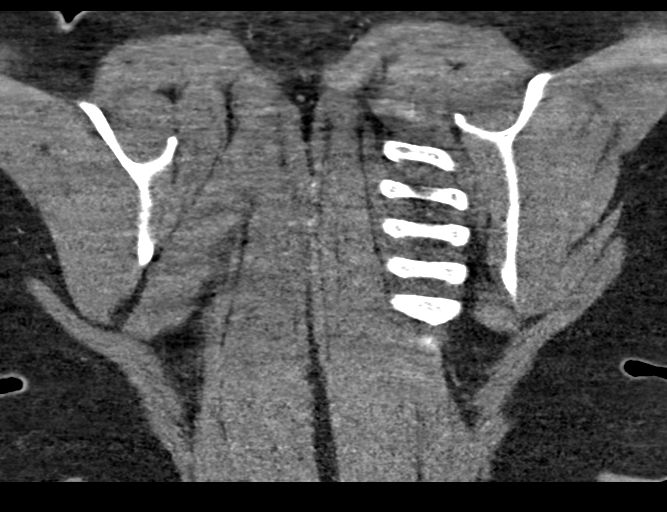

[18 of 46 positions shown; findings below may reference images not displayed]

RADIATION DOSE REDUCTION: This exam was performed according to the
departmental dose-optimization program which includes automated
exposure control, adjustment of the mA and/or kV according to
patient size and/or use of iterative reconstruction technique.

CONTRAST:  100mL OMNIPAQUE IOHEXOL 350 MG/ML SOLN
FINDINGS: Body habitus reduces diagnostic sensitivity and specificity.

Cardiovascular: No filling defect is identified in the pulmonary
arterial tree to suggest pulmonary embolus. Mild to moderate
cardiomegaly.

Mediastinum/Nodes: Unremarkable

Lungs/Pleura: Scarring or subsegmental atelectasis inferiorly in the
right middle lobe. Similar scarring or subsegmental atelectasis
inferiorly in the lingula and along the left hemidiaphragm.

Upper Abdomen: Unremarkable

Musculoskeletal: Unremarkable

Review of the MIP images confirms the above findings.
IMPRESSION: 1. No filling defect is identified in the pulmonary arterial tree to
suggest pulmonary embolus.
2. Mild to moderate cardiomegaly.
3. Minimal scarring or atelectasis in the lingula and right middle
lobe.

## 2023-04-19 ENCOUNTER — Encounter (HOSPITAL_COMMUNITY): Payer: Self-pay | Admitting: Emergency Medicine

## 2023-04-19 ENCOUNTER — Emergency Department (HOSPITAL_COMMUNITY)
Admission: EM | Admit: 2023-04-19 | Discharge: 2023-04-20 | Disposition: A | Discharge status: Home / Self Care | Payer: Medicaid Other | Attending: Emergency Medicine | Admitting: Emergency Medicine

## 2023-04-19 ENCOUNTER — Other Ambulatory Visit: Payer: Self-pay

## 2023-04-19 DIAGNOSIS — R600 Localized edema: Secondary | ICD-10-CM | POA: Insufficient documentation

## 2023-04-19 DIAGNOSIS — M7989 Other specified soft tissue disorders: Secondary | ICD-10-CM | POA: Diagnosis present

## 2023-04-19 NOTE — ED Provider Notes (Signed)
[place] EMERGENCY DEPARTMENT AT [facility] Provider Note   CSN: [id number] Arrival date & time: 04/19/23  2246     History {Add pertinent medical, surgical, social history, OB history to HPI:1} Chief Complaint  Patient presents with   Leg Swelling    [name] is a 32 y.o. female.  Patient presents to the emergency department with complaints of swelling of her legs from the knees down.  Swelling is bilateral.  She reports that she has minor swelling from time to time but for the last 5 days swelling has been present, has not gone down and her feet are hurting.  No trauma.  No shortness of breath.  No history of kidney disease, heart disease.       Home Medications Prior to Admission medications   Medication Sig Start Date End Date Taking? Authorizing Provider  pantoprazole (PROTONIX) 40 MG tablet Take 1 tablet (40 mg total) by mouth daily. Can start AFTER stool sample 09/08/22   Doree Albee, PA-C      Allergies    Patient has no known allergies.    Review of Systems   Review of Systems  Physical Exam Updated Vital Signs BP 136/87   Pulse 88   Temp 98.4 F (36.9 C)   Resp 16   Wt 110 kg   SpO2 98%   BMI 47.36 kg/m  Physical Exam Vitals and nursing note reviewed.  Constitutional:      General: She is not in acute distress.    Appearance: She is well-developed.  HENT:     Head: Normocephalic and atraumatic.     Mouth/Throat:     Mouth: Mucous membranes are moist.  Eyes:     General: Vision grossly intact. Gaze aligned appropriately.     Extraocular Movements: Extraocular movements intact.     Conjunctiva/sclera: Conjunctivae normal.  Cardiovascular:     Rate and Rhythm: Normal rate and regular rhythm.     Pulses: Normal pulses.     Heart sounds: Normal heart sounds, S1 normal and S2 normal. No murmur heard.    No friction rub. No gallop.  Pulmonary:     Effort: Pulmonary effort is normal. No respiratory distress.     Breath  sounds: Normal breath sounds.  Abdominal:     General: Bowel sounds are normal.     Palpations: Abdomen is soft.     Tenderness: There is no abdominal tenderness. There is no guarding or rebound.     Hernia: No hernia is present.  Musculoskeletal:        General: No swelling.     Cervical back: Full passive range of motion without pain, normal range of motion and neck supple. No spinous process tenderness or muscular tenderness. Normal range of motion.     Right lower leg: Edema present.     Left lower leg: Edema present.  Skin:    General: Skin is warm and dry.     Capillary Refill: Capillary refill takes less than 2 seconds.     Findings: No ecchymosis, erythema, rash or wound.  Neurological:     General: No focal deficit present.     Mental Status: She is alert and oriented to person, place, and time.     GCS: GCS eye subscore is 4. GCS verbal subscore is 5. GCS motor subscore is 6.     Cranial Nerves: Cranial nerves 2-12 are intact.     Sensory: Sensation is intact.  Motor: Motor function is intact.     Coordination: Coordination is intact.  Psychiatric:        Attention and Perception: Attention normal.        Mood and Affect: Mood normal.        Speech: Speech normal.        Behavior: Behavior normal.     ED Results / Procedures / Treatments   Labs (all labs ordered are listed, but only abnormal results are displayed) Labs Reviewed  CBC WITH DIFFERENTIAL/PLATELET  COMPREHENSIVE METABOLIC PANEL  URINALYSIS, ROUTINE W REFLEX MICROSCOPIC  PREGNANCY, URINE  BRAIN NATRIURETIC PEPTIDE    EKG None  Radiology No results found.  Procedures Procedures  {Document cardiac monitor, telemetry assessment procedure when appropriate:1}  Medications Ordered in ED Medications - No data to display  ED Course/ Medical Decision Making/ A&P   {   Click here for ABCD2, HEART and other calculatorsREFRESH Note before signing :1}                          Medical Decision  Making Amount and/or Complexity of Data Reviewed Labs: ordered.   ***  {Document critical care time when appropriate:1} {Document review of labs and clinical decision tools ie heart score, Chads2Vasc2 etc:1}  {Document your independent review of radiology images, and any outside records:1} {Document your discussion with family members, caretakers, and with consultants:1} {Document social determinants of health affecting pt's care:1} {Document your decision making why or why not admission, treatments were needed:1} Final Clinical Impression(s) / ED Diagnoses Final diagnoses:  None    Rx / DC Orders ED Discharge Orders     None

## 2023-04-19 NOTE — ED Triage Notes (Signed)
BIB [facility] from home for complaints of bilateral feet swelling and pain x 5 days. Denies any injury. Pt was able to ambulated to ambulance but it was painful. Pt denies diabetes. Has had this happen in the past but it went away after a day or two.

## 2023-04-20 LAB — COMPREHENSIVE METABOLIC PANEL
ALT: 8 U/L (ref 0–44)
AST: 19 U/L (ref 15–41)
Albumin: 3.6 g/dL (ref 3.5–5.0)
Alkaline Phosphatase: 60 U/L (ref 38–126)
Anion gap: 8 (ref 5–15)
BUN: 10 mg/dL (ref 6–20)
CO2: 26 mmol/L (ref 22–32)
Calcium: 8.8 mg/dL — ABNORMAL LOW (ref 8.9–10.3)
Chloride: 103 mmol/L (ref 98–111)
Creatinine, Ser: 0.77 mg/dL (ref 0.44–1.00)
GFR, Estimated: >60 mL/min (ref >60)
Glucose, Bld: 102 mg/dL — ABNORMAL HIGH (ref 70–99)
Potassium: 3.9 mmol/L (ref 3.5–5.1)
Sodium: 137 mmol/L (ref 135–145)
Total Bilirubin: 0.2 mg/dL — ABNORMAL LOW (ref 0.3–1.2)
Total Protein: 7.7 g/dL (ref 6.5–8.1)

## 2023-04-20 LAB — CBC WITH DIFFERENTIAL/PLATELET
Abs Immature Granulocytes: 0.02 10*3/uL (ref 0.00–0.07)
Basophils Absolute: 0 10*3/uL (ref 0.0–0.1)
Basophils Relative: 1 %
Eosinophils Absolute: 0.1 10*3/uL (ref 0.0–0.5)
Eosinophils Relative: 2 %
HCT: 29.8 % — ABNORMAL LOW (ref 36.0–46.0)
Hemoglobin: 8.4 g/dL — ABNORMAL LOW (ref 12.0–15.0)
Immature Granulocytes: 0 %
Lymphocytes Relative: 31 %
Lymphs Abs: 1.9 10*3/uL (ref 0.7–4.0)
MCH: 20.8 pg — ABNORMAL LOW (ref 26.0–34.0)
MCHC: 28.2 g/dL — ABNORMAL LOW (ref 30.0–36.0)
MCV: 73.9 fL — ABNORMAL LOW (ref 80.0–100.0)
Monocytes Absolute: 0.4 10*3/uL (ref 0.1–1.0)
Monocytes Relative: 7 %
Neutro Abs: 3.8 10*3/uL (ref 1.7–7.7)
Neutrophils Relative %: 59 %
Platelets: 419 10*3/uL — ABNORMAL HIGH (ref 150–400)
RBC: 4.03 MIL/uL (ref 3.87–5.11)
RDW: 18.6 % — ABNORMAL HIGH (ref 11.5–15.5)
WBC: 6.3 10*3/uL (ref 4.0–10.5)
nRBC: 0 % (ref 0.0–0.2)

## 2023-04-20 LAB — URINALYSIS, ROUTINE W REFLEX MICROSCOPIC
Bilirubin Urine: NEGATIVE
Glucose, UA: NEGATIVE mg/dL
Ketones, ur: NEGATIVE mg/dL
Nitrite: NEGATIVE
Protein, ur: NEGATIVE mg/dL
Specific Gravity, Urine: 1.016 (ref 1.005–1.030)
pH: 6 (ref 5.0–8.0)

## 2023-04-20 LAB — BRAIN NATRIURETIC PEPTIDE: B Natriuretic Peptide: 7.5 pg/mL (ref 0.0–100.0)

## 2023-04-20 LAB — PREGNANCY, URINE: Preg Test, Ur: NEGATIVE

## 2023-04-20 MED ORDER — FUROSEMIDE 20 MG PO TABS: 20.0000 mg | ORAL_TABLET | Freq: Every day | ORAL | 0 refills | Status: AC

## 2024-08-19 ENCOUNTER — Ambulatory Visit (HOSPITAL_COMMUNITY)
Admission: EM | Admit: 2024-08-19 | Discharge: 2024-08-19 | Disposition: A | Discharge status: Home / Self Care | Attending: Student | Admitting: Student

## 2024-08-19 ENCOUNTER — Encounter (HOSPITAL_COMMUNITY): Payer: Self-pay

## 2024-08-19 DIAGNOSIS — R1314 Dysphagia, pharyngoesophageal phase: Secondary | ICD-10-CM | POA: Diagnosis not present

## 2024-08-19 DIAGNOSIS — E049 Nontoxic goiter, unspecified: Secondary | ICD-10-CM

## 2024-08-19 DIAGNOSIS — R1319 Other dysphagia: Secondary | ICD-10-CM

## 2024-08-19 DIAGNOSIS — R1110 Vomiting, unspecified: Secondary | ICD-10-CM

## 2024-08-19 MED ORDER — PANTOPRAZOLE SODIUM 40 MG PO TBEC
40.0000 mg | DELAYED_RELEASE_TABLET | Freq: Every day | ORAL | 3 refills | Status: AC
Start: 2024-08-19 — End: 2024-09-18

## 2024-08-19 NOTE — ED Triage Notes (Signed)
 Pt states at random times she feels a tightness in her upper chest more off to the right sides.  States she also has a pain all the way down her throat when she swallows food. States it has been going on for the past 3 weeks.  States she hasn't taken anything for it.

## 2024-08-19 NOTE — ED Provider Notes (Signed)
 [facility]    CSN: [id number] Arrival date & time: 08/19/24  9180      History   Chief Complaint Chief Complaint  Patient presents with   Chest Pain    HPI [name] is a 33 y.o. female presenting with chest tightness and difficulty swallowing. History cardiomegaly, chest pain, diverticulosis, OSA, esophageal dysphagia (Dr. Stacia, [facility]). Pt states at random times she feels a tightness in her upper chest more off to the right sides. States she also has a pain all the way down her throat when she swallows food. States it has been going on for the past 3 weeks. States she hasn't taken anything for it.  Denies n/v/d/abd pain/abd burning. Her last visit with GI was about 2 years ago.  Previously took pantoprazole  for esophageal dysphagia, but no longer. Denies symptoms at the time of this visit.  History C-section.  HPI  Past Medical History:  Diagnosis Date   Cardiomegaly    Chest pain    Diverticulosis    Pregnancy induced hypertension     Patient Active Problem List   Diagnosis Date Noted   OSA (obstructive sleep apnea) 06/20/2022   Cesarean delivery, delivered, current hospitalization 03/17/2018   S/P cesarean section 10/17/2014    Past Surgical History:  Procedure Laterality Date   CESAREAN SECTION     CESAREAN SECTION N/A 10/17/2014   Procedure: REPEAT CESAREAN SECTION;  Surgeon: Aida DELENA Na, MD;  Location: [facility];  Service: Obstetrics;  Laterality: N/A;   CESAREAN SECTION N/A 03/17/2018   Procedure: REPEAT CESAREAN SECTION;  Surgeon: Okey Leader, MD;  Location: [facility];  Service: Obstetrics;  Laterality: N/A;  Heather RNFA    OB History     Gravida  3   Para  3   Term  3   Preterm      AB      Living  3      SAB      IAB      Ectopic      Multiple  0   Live Births  3            Home Medications    Prior to Admission medications   Medication Sig Start Date End Date Taking? Authorizing  Provider  furosemide  (LASIX ) 20 MG tablet Take 1 tablet (20 mg total) by mouth daily. 04/20/23   Haze Lonni PARAS, MD  pantoprazole  (PROTONIX ) 40 MG tablet Take 1 tablet (40 mg total) by mouth daily. 08/19/24 09/18/24  Arlyss Leita BRAVO, PA-C    Family History Family History  Problem Relation Age of Onset   Arthritis Mother    Hypertension Mother    Colon cancer Neg Hx    Esophageal cancer Neg Hx     Social History Social History   Tobacco Use   Smoking status: Never   Smokeless tobacco: Never  Vaping Use   Vaping status: Never Used  Substance Use Topics   Alcohol use: Not Currently   Drug use: No     Allergies   Patient has no known allergies.   Review of Systems Review of Systems  Constitutional:  Negative for chills and fever.  HENT:  Positive for trouble swallowing. Negative for ear pain and sore throat.   Eyes:  Negative for pain and visual disturbance.  Respiratory:  Negative for cough and shortness of breath.   Cardiovascular:  Negative for chest pain and palpitations.  Gastrointestinal:  Negative for abdominal pain and  vomiting.  Genitourinary:  Negative for dysuria and hematuria.  Musculoskeletal:  Negative for arthralgias and back pain.  Skin:  Negative for color change and rash.  Neurological:  Negative for seizures and syncope.  All other systems reviewed and are negative.    Physical Exam Triage Vital Signs ED Triage Vitals [08/19/24 0855]  Encounter Vitals Group     BP      Girls Systolic BP Percentile      Girls Diastolic BP Percentile      Boys Systolic BP Percentile      Boys Diastolic BP Percentile      Pulse      Resp      Temp      Temp src      SpO2      Weight      Height      Head Circumference      Peak Flow      Pain Score 0     Pain Loc      Pain Education      Exclude from Growth Chart    No data found.  Updated Vital Signs BP 124/82 (BP Location: Left Arm)   Pulse 66   Temp 98 F (36.7 C) (Oral)   Resp 16    LMP 08/19/2024 (Exact Date)   SpO2 98%   Visual Acuity Right Eye Distance:   Left Eye Distance:   Bilateral Distance:    Right Eye Near:   Left Eye Near:    Bilateral Near:     Physical Exam Vitals reviewed.  Constitutional:      General: She is not in acute distress.    Appearance: Normal appearance. She is not ill-appearing.  HENT:     Head: Normocephalic and atraumatic.     Mouth/Throat:     Mouth: Mucous membranes are moist.     Comments: Moist mucous membranes Eyes:     Extraocular Movements: Extraocular movements intact.     Pupils: Pupils are equal, round, and reactive to light.  Cardiovascular:     Rate and Rhythm: Normal rate and regular rhythm.     Heart sounds: Normal heart sounds.  Pulmonary:     Effort: Pulmonary effort is normal.     Breath sounds: Normal breath sounds. No wheezing, rhonchi or rales.  Abdominal:     General: Bowel sounds are normal. There is no distension.     Palpations: Abdomen is soft. There is no mass.     Tenderness: There is no abdominal tenderness. There is no right CVA tenderness, left CVA tenderness, guarding or rebound. Negative signs include Murphy's sign, Rovsing's sign and McBurney's sign.  Skin:    General: Skin is warm.     Capillary Refill: Capillary refill takes less than 2 seconds.     Comments: Good skin turgor  Neurological:     General: No focal deficit present.     Mental Status: She is alert and oriented to person, place, and time.  Psychiatric:        Mood and Affect: Mood normal.        Behavior: Behavior normal.      UC Treatments / Results  Labs (all labs ordered are listed, but only abnormal results are displayed) Labs Reviewed - No data to display  EKG   Radiology No results found.  Procedures Procedures (including critical care time)  Medications Ordered in UC Medications - No data to display  Initial Impression / Assessment and Plan /  UC Course  I have reviewed the triage vital signs and  the nursing notes.  Pertinent labs & imaging results that were available during my care of the patient were reviewed by me and considered in my medical decision making (see chart for details).     Patient is a pleasant 33 year old female presenting with esophageal dysphagia, which is a condition that she has seen GI for in the past.  She is asymptomatic at the time of this visit.  Due to her cardiac history, we checked an EKG, which is NSR, and unchanged when compared with 03/2022 EKG.  Resume pantoprazole .  Follow-up with GI -she is already a patient at [facility], and has seen them for this issue in the past.  Provided her with their information.  Final Clinical Impressions(s) / UC Diagnoses   Final diagnoses:  Esophageal dysphagia     Discharge Instructions      -Resume pantoprazole  - Please call your gastroenterologist and schedule a follow-up. -While a normal EKG is a good sign, it does not totally exclude cardiac pathology (heart attack, acute cardiac event), so if your symptoms change or worsen, head to the emergency department.  This includes new left-sided chest pain, new dizziness, new weakness.     ED Prescriptions     Medication Sig Dispense Auth. Provider   pantoprazole  (PROTONIX ) 40 MG tablet Take 1 tablet (40 mg total) by mouth daily. 30 tablet Denyce Harr E, PA-C      PDMP not reviewed this encounter.   Arlyss Leita BRAVO, PA-C 08/19/24 [phone]

## 2024-08-19 NOTE — Discharge Instructions (Addendum)
-  Resume pantoprazole  - Please call your gastroenterologist and schedule a follow-up. -While a normal EKG is a good sign, it does not totally exclude cardiac pathology (heart attack, acute cardiac event), so if your symptoms change or worsen, head to the emergency department.  This includes new left-sided chest pain, new dizziness, new weakness.

## 2024-10-17 ENCOUNTER — Emergency Department (HOSPITAL_COMMUNITY)
Admission: EM | Admit: 2024-10-17 | Discharge: 2024-10-17 | Disposition: A | Discharge status: Home / Self Care | Attending: Emergency Medicine | Admitting: Emergency Medicine

## 2024-10-17 ENCOUNTER — Emergency Department (HOSPITAL_COMMUNITY)

## 2024-10-17 DIAGNOSIS — M25521 Pain in right elbow: Secondary | ICD-10-CM | POA: Diagnosis present

## 2024-10-17 DIAGNOSIS — M7021 Olecranon bursitis, right elbow: Secondary | ICD-10-CM | POA: Diagnosis not present

## 2024-10-17 DIAGNOSIS — Y939 Activity, unspecified: Secondary | ICD-10-CM | POA: Diagnosis not present

## 2024-10-17 MED ORDER — MELOXICAM 15 MG PO TABS: 15.0000 mg | ORAL_TABLET | Freq: Every day | ORAL | 0 refills | Status: AC

## 2024-10-17 MED ORDER — METHYLPREDNISOLONE 4 MG PO TBPK: ORAL_TABLET | Freq: Every day | ORAL | 0 refills | Status: AC

## 2024-10-17 NOTE — ED Triage Notes (Signed)
 Patient here with right elbow pain for the past 2 weeks. She states that it is swollen and it hurts to move. She does not recall injuring the elbow.

## 2024-10-17 NOTE — ED Provider Triage Note (Signed)
 Emergency Medicine Provider Triage Evaluation Note  Rekia L Assad , a 33 y.o. female  was evaluated in triage.  Pt complains of elbow pain. Atraumatic pain to R posterior elbow x 2 weeks, worse with movement.  No fever, shoulder pain, wrist pain, numbness or weakness.  Bumped her elbow last night and pain worsen  Review of Systems  Positive: As above Negative: As above  Physical Exam  BP 131/77 (BP Location: Right Arm)   Pulse 86   Temp 99.1 F (37.3 C) (Oral)   Resp 20   SpO2 100%  Gen:   Awake, no distress   Resp:  Normal effort  MSK:   Moves extremities without difficulty  Other:    Medical Decision Making  Medically screening exam initiated at 2:25 PM.  Appropriate orders placed.  Tiona L Strange was informed that the remainder of the evaluation will be completed by another provider, this initial triage assessment does not replace that evaluation, and the importance of remaining in the ED until their evaluation is complete.     Nivia Colon, PA-C 10/17/24 1426

## 2024-10-18 NOTE — ED Provider Notes (Signed)
 " [facility] Provider Note   CSN: [id number] Arrival date & time: 10/17/24  1218     Patient presents with: No chief complaint on file.   [name] is a 33 y.o. female.   Pt is a 33 yo female with pmhx significant for GERD and pregnancy induced htn.  Pt presents to the ED today with right elbow pain.  No trauma.  No redness or swelling.  Sx have been going on for 2 weeks.  No f/c.        Prior to Admission medications  Medication Sig Start Date End Date Taking? Authorizing Provider  meloxicam  (MOBIC ) 15 MG tablet Take 1 tablet (15 mg total) by mouth daily. 10/17/24  Yes Dean Clarity, MD  methylPREDNISolone  (MEDROL  DOSEPAK) 4 MG TBPK tablet Take by mouth daily. Day 1:  2 pills at breakfast, 1 pill at lunch, 1 pill after supper, 2 pills at bedtime;Day 2:  1 pill at breakfast, 1 pill at lunch, 1 pill after supper, 2 pills at bedtime;Day 3:  1 pill at breakfast, 1 pill at lunch, 1 pill after supper, 1 pill at bedtime;Day 4:  1 pill at breakfast, 1 pill at lunch, 1 pill at bedtime;Day 5:  1 pill at breakfast, 1 pill at bedtime;Day 6:  1 pill at breakfast 10/17/24  Yes Dean Clarity, MD  furosemide  (LASIX ) 20 MG tablet Take 1 tablet (20 mg total) by mouth daily. 04/20/23   Haze Lonni PARAS, MD  pantoprazole  (PROTONIX ) 40 MG tablet Take 1 tablet (40 mg total) by mouth daily. 08/19/24 09/18/24  Graham, Laura E, PA-C    Allergies: Patient has no known allergies.    Review of Systems  Musculoskeletal:        Right elbow pain  All other systems reviewed and are negative.   Updated Vital Signs BP (!) 147/91 (BP Location: Right Arm)   Pulse 84   Temp 97.6 F (36.4 C) (Oral)   Resp 16   SpO2 100%   Physical Exam Vitals and nursing note reviewed.  Constitutional:      Appearance: Normal appearance.  HENT:     Head: Normocephalic and atraumatic.     Right Ear: External ear normal.     Left Ear: External ear normal.      Nose: Nose normal.     Mouth/Throat:     Mouth: Mucous membranes are moist.     Pharynx: Oropharynx is clear.  Eyes:     Extraocular Movements: Extraocular movements intact.     Conjunctiva/sclera: Conjunctivae normal.     Pupils: Pupils are equal, round, and reactive to light.  Cardiovascular:     Rate and Rhythm: Normal rate and regular rhythm.     Pulses: Normal pulses.     Heart sounds: Normal heart sounds.  Pulmonary:     Effort: Pulmonary effort is normal.     Breath sounds: Normal breath sounds.  Musculoskeletal:        General: Normal range of motion.     Right elbow: Tenderness present.       Arms:     Cervical back: Normal range of motion and neck supple.     Comments: Mild swelling of the right elbow bursa; no redness  Skin:    General: Skin is warm.     Capillary Refill: Capillary refill takes less than 2 seconds.  Neurological:     General: No focal deficit present.     Mental  Status: She is alert and oriented to person, place, and time.  Psychiatric:        Mood and Affect: Mood normal.        Behavior: Behavior normal.     (all labs ordered are listed, but only abnormal results are displayed) Labs Reviewed - No data to display  EKG: None  Radiology: DG Elbow Complete Right Result Date: 10/17/2024 CLINICAL DATA:  Right elbow pain.  No known injury. EXAM: RIGHT ELBOW - COMPLETE 3+ VIEW COMPARISON:  None Available. FINDINGS: There is no evidence of fracture, dislocation, or joint effusion. Alignment and joint spaces are normal. There is no evidence of arthropathy or other focal bone abnormality. Mild soft tissue edema about the dorsal aspect of the elbow. No radiopaque foreign body or soft tissue gas. IMPRESSION: Mild soft tissue edema about the dorsal aspect of the elbow. No osseous abnormality. Electronically Signed   By: Andrea Gasman M.D.   On: 10/17/2024 14:52     Procedures   Medications Ordered in the ED - No data to display                                   Medical Decision Making Amount and/or Complexity of Data Reviewed Radiology: ordered.  Risk Prescription drug management.   This patient presents to the ED for concern of right elbow pain, this involves an extensive number of treatment options, and is a complaint that carries with it a high risk of complications and morbidity.  The differential diagnosis includes fx, sprain, contusion, arthritis, bursitis   Co morbidities that complicate the patient evaluation  GERD and pregnancy induced htn   Additional history obtained:  Additional history obtained from epic chart review  Imaging Studies ordered:  I ordered imaging studies including r elbow  I independently visualized and interpreted imaging which showed  Mild soft tissue edema about the dorsal aspect of the elbow. No  osseous abnormality.   I agree with the radiologist interpretation   Medicines ordered and prescription drug management:  I have reviewed the patients home medicines and have made adjustments as needed   Problem List / ED Course:  Right elbow pain:  likely bursitis.  Pt is stable for d/c.  Return if worse.  F/u with ortho.   Reevaluation:  After the interventions noted above, I reevaluated the patient and found that they have :stayed the same   Social Determinants of Health:  Lives at home   Dispostion:  After consideration of the diagnostic results and the patients response to treatment, I feel that the patent would benefit from discharge with outpatient f/u.       Final diagnoses:  Olecranon bursitis of right elbow    ED Discharge Orders          Ordered    methylPREDNISolone  (MEDROL  DOSEPAK) 4 MG TBPK tablet  Daily        10/17/24 1654    meloxicam  (MOBIC ) 15 MG tablet  Daily        10/17/24 1654               Dean Clarity, MD 10/18/24 2001  "
